# Patient Record
Sex: Female | Born: 1995 | Race: White | Hispanic: No | Marital: Single | Smoking: Current every day smoker
Health system: Southern US, Community
[De-identification: ages and names within clinical notes are randomized; demographics above are authoritative.]

## PROBLEM LIST (undated history)

## (undated) ENCOUNTER — Inpatient Hospital Stay (HOSPITAL_COMMUNITY): Payer: Self-pay

## (undated) DIAGNOSIS — F419 Anxiety disorder, unspecified: Secondary | ICD-10-CM

## (undated) HISTORY — PX: DILATION AND CURETTAGE OF UTERUS: SHX78

## (undated) HISTORY — DX: Anxiety disorder, unspecified: F41.9

---
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
## 2015-01-02 ENCOUNTER — Emergency Department (HOSPITAL_COMMUNITY)
Admission: EM | Admit: 2015-01-02 | Discharge: 2015-01-02 | Discharge status: Against Medical Advice | Payer: Medicaid Other | Attending: Emergency Medicine | Admitting: Emergency Medicine

## 2015-01-02 ENCOUNTER — Encounter (HOSPITAL_COMMUNITY): Payer: Self-pay | Admitting: Emergency Medicine

## 2015-01-02 ENCOUNTER — Emergency Department (HOSPITAL_COMMUNITY): Payer: Medicaid Other

## 2015-01-02 DIAGNOSIS — Y9289 Other specified places as the place of occurrence of the external cause: Secondary | ICD-10-CM | POA: Diagnosis not present

## 2015-01-02 DIAGNOSIS — Y998 Other external cause status: Secondary | ICD-10-CM | POA: Diagnosis not present

## 2015-01-02 DIAGNOSIS — W1809XA Striking against other object with subsequent fall, initial encounter: Secondary | ICD-10-CM | POA: Diagnosis not present

## 2015-01-02 DIAGNOSIS — S99929A Unspecified injury of unspecified foot, initial encounter: Secondary | ICD-10-CM | POA: Insufficient documentation

## 2015-01-02 DIAGNOSIS — S8990XA Unspecified injury of unspecified lower leg, initial encounter: Secondary | ICD-10-CM | POA: Insufficient documentation

## 2015-01-02 DIAGNOSIS — Z720 Tobacco use: Secondary | ICD-10-CM | POA: Insufficient documentation

## 2015-01-02 DIAGNOSIS — Y9341 Activity, dancing: Secondary | ICD-10-CM | POA: Insufficient documentation

## 2015-01-02 NOTE — ED Notes (Signed)
Pt was at working dancing when she hit her foot and then fell and hit her knee.  Pt states that she felt a "pop" in her knee.

## 2017-04-22 ENCOUNTER — Encounter: Payer: Self-pay | Admitting: Obstetrics and Gynecology

## 2017-04-22 ENCOUNTER — Other Ambulatory Visit (HOSPITAL_COMMUNITY)
Admission: RE | Admit: 2017-04-22 | Discharge: 2017-04-22 | Disposition: A | Discharge status: Home / Self Care | Payer: Medicaid Other | Source: Ambulatory Visit | Attending: Obstetrics and Gynecology | Admitting: Obstetrics and Gynecology

## 2017-04-22 ENCOUNTER — Encounter (INDEPENDENT_AMBULATORY_CARE_PROVIDER_SITE_OTHER): Payer: Medicaid Other | Admitting: *Deleted

## 2017-04-22 ENCOUNTER — Ambulatory Visit (INDEPENDENT_AMBULATORY_CARE_PROVIDER_SITE_OTHER): Payer: Medicaid Other | Admitting: Obstetrics and Gynecology

## 2017-04-22 VITALS — BP 113/69 | HR 90 | Wt 99.0 lb

## 2017-04-22 DIAGNOSIS — G43909 Migraine, unspecified, not intractable, without status migrainosus: Secondary | ICD-10-CM | POA: Insufficient documentation

## 2017-04-22 DIAGNOSIS — O021 Missed abortion: Secondary | ICD-10-CM | POA: Insufficient documentation

## 2017-04-22 DIAGNOSIS — Z3481 Encounter for supervision of other normal pregnancy, first trimester: Secondary | ICD-10-CM

## 2017-04-22 DIAGNOSIS — O09299 Supervision of pregnancy with other poor reproductive or obstetric history, unspecified trimester: Secondary | ICD-10-CM | POA: Insufficient documentation

## 2017-04-22 DIAGNOSIS — O09291 Supervision of pregnancy with other poor reproductive or obstetric history, first trimester: Secondary | ICD-10-CM

## 2017-04-22 DIAGNOSIS — O26891 Other specified pregnancy related conditions, first trimester: Secondary | ICD-10-CM | POA: Diagnosis not present

## 2017-04-22 DIAGNOSIS — Z3480 Encounter for supervision of other normal pregnancy, unspecified trimester: Secondary | ICD-10-CM | POA: Diagnosis present

## 2017-04-22 DIAGNOSIS — Z3A13 13 weeks gestation of pregnancy: Secondary | ICD-10-CM | POA: Diagnosis not present

## 2017-04-22 DIAGNOSIS — G43019 Migraine without aura, intractable, without status migrainosus: Secondary | ICD-10-CM

## 2017-04-22 DIAGNOSIS — Z3490 Encounter for supervision of normal pregnancy, unspecified, unspecified trimester: Secondary | ICD-10-CM | POA: Insufficient documentation

## 2017-04-22 MED ORDER — PRENATAL GUMMIES/DHA & FA 0.4-32.5 MG PO CHEW: 1.0000 | CHEWABLE_TABLET | Freq: Every day | ORAL | 12 refills | Status: DC

## 2017-04-22 MED ORDER — PROMETHAZINE HCL 25 MG PO TABS: 25.0000 mg | ORAL_TABLET | Freq: Four times a day (QID) | ORAL | 3 refills | Status: DC | PRN

## 2017-04-22 MED ORDER — CYCLOBENZAPRINE HCL 5 MG PO TABS: 5.0000 mg | ORAL_TABLET | Freq: Three times a day (TID) | ORAL | 3 refills | Status: DC | PRN

## 2017-04-22 MED ORDER — METOCLOPRAMIDE HCL 10 MG PO TABS: 10.0000 mg | ORAL_TABLET | Freq: Four times a day (QID) | ORAL | 3 refills | Status: DC

## 2017-04-22 NOTE — Patient Instructions (Signed)
Morning Sickness Morning sickness is when you feel sick to your stomach (nauseous) during pregnancy. This nauseous feeling may or may not come with vomiting. It often occurs in the morning but can be a problem any time of day. Morning sickness is most common during the first trimester, but it may continue throughout pregnancy. While morning sickness is unpleasant, it is usually harmless unless you develop severe and continual vomiting (hyperemesis gravidarum). This condition requires more intense treatment. What are the causes? The cause of morning sickness is not completely known but seems to be related to normal hormonal changes that occur in pregnancy. What increases the risk? You are at greater risk if you:  Experienced nausea or vomiting before your pregnancy.  Had morning sickness during a previous pregnancy.  Are pregnant with more than one baby, such as twins.  How is this treated? Do not use any medicines (prescription, over-the-counter, or herbal) for morning sickness without first talking to your health care provider. Your health care provider may prescribe or recommend:  Vitamin B6 supplements.  Anti-nausea medicines.  The herbal medicine ginger.  Follow these instructions at home:  Only take over-the-counter or prescription medicines as directed by your health care provider.  Taking multivitamins before getting pregnant can prevent or decrease the severity of morning sickness in most women.  Eat a piece of dry toast or unsalted crackers before getting out of bed in the morning.  Eat five or six small meals a day.  Eat dry and bland foods (rice, baked potato). Foods high in carbohydrates are often helpful.  Do not drink liquids with your meals. Drink liquids between meals.  Avoid greasy, fatty, and spicy foods.  Get someone to cook for you if the smell of any food causes nausea and vomiting.  If you feel nauseous after taking prenatal vitamins, take the vitamins at  night or with a snack.  Snack on protein foods (nuts, yogurt, cheese) between meals if you are hungry.  Eat unsweetened gelatins for desserts.  Wearing an acupressure wristband (worn for sea sickness) may be helpful.  Acupuncture may be helpful.  Do not smoke.  Get a humidifier to keep the air in your house free of odors.  Get plenty of fresh air. Contact a health care provider if:  Your home remedies are not working, and you need medicine.  You feel dizzy or lightheaded.  You are losing weight. Get help right away if:  You have persistent and uncontrolled nausea and vomiting.  You pass out (faint). This information is not intended to replace advice given to you by your health care provider. Make sure you discuss any questions you have with your health care provider. Document Released: 09/06/2006 Document Revised: 12/22/2015 Document Reviewed: 12/31/2012 Elsevier Interactive Patient Education  2017 [employer] Eating Plan for Pregnant Women While you are pregnant, your body will require additional nutrition to help support your growing baby. It is recommended that you consume:  150 additional calories each day during your first trimester.  300 additional calories each day during your second trimester.  300 additional calories each day during your third trimester.  Eating a healthy, well-balanced diet is very important for your health and for your baby's health. You also have a higher need for some vitamins and minerals, such as folic acid, calcium, iron, and vitamin D. What do I need to know about eating during pregnancy?  Do not try to lose weight or go on a diet during pregnancy.  Choose healthy, nutritious  foods. Choose  of a sandwich with a glass of milk instead of a candy bar or a high-calorie sugar-sweetened beverage.  Limit your overall intake of foods that have "empty calories." These are foods that have little nutritional value, such as sweets, desserts,  candies, sugar-sweetened beverages, and fried foods.  Eat a variety of foods, especially fruits and vegetables.  Take a prenatal vitamin to help meet the additional needs during pregnancy, specifically for folic acid, iron, calcium, and vitamin D.  Remember to stay active. Ask your health care provider for exercise recommendations that are specific to you.  Practice good food safety and cleanliness, such as washing your hands before you eat and after you prepare raw meat. This helps to prevent foodborne illnesses, such as listeriosis, that can be very dangerous for your baby. Ask your health care provider for more information about listeriosis. What does 150 extra calories look like? Healthy options for an additional 150 calories each day could be any of the following:  Plain low-fat yogurt (6-8 oz) with  cup of berries.  1 apple with 2 teaspoons of peanut butter.  Cut-up vegetables with  cup of hummus.  Low-fat chocolate milk (8 oz or 1 cup).  1 string cheese with 1 medium orange.   of a peanut butter and jelly sandwich on whole-wheat bread (1 tsp of peanut butter).  For 300 calories, you could eat two of those healthy options each day. What is a healthy amount of weight to gain? The recommended amount of weight for you to gain is based on your pre-pregnancy BMI. If your pre-pregnancy BMI was:  Less than 18 (underweight), you should gain 28-40 lb.  18-24.9 (normal), you should gain 25-35 lb.  25-29.9 (overweight), you should gain 15-25 lb.  Greater than 30 (obese), you should gain 11-20 lb.  What if I am having twins or multiples? Generally, pregnant women who will be having twins or multiples may need to increase their daily calories by 300-600 calories each day. The recommended range for total weight gain is 25-54 lb, depending on your pre-pregnancy BMI. Talk with your health care provider for specific guidance about additional nutritional needs, weight gain, and exercise  during your pregnancy. What foods can I eat? Grains Any grains. Try to choose whole grains, such as whole-wheat bread, oatmeal, or brown rice. Vegetables Any vegetables. Try to eat a variety of colors and types of vegetables to get a full range of vitamins and minerals. Remember to wash your vegetables well before eating. Fruits Any fruits. Try to eat a variety of colors and types of fruit to get a full range of vitamins and minerals. Remember to wash your fruits well before eating. Meats and Other Protein Sources Lean meats, including chicken, [place], fish, and lean cuts of beef, veal, or pork. Make sure that all meats are cooked to "well done." Tofu. Tempeh. Beans. Eggs. Peanut butter and other nut butters. Seafood, such as shrimp, crab, and lobster. If you choose fish, select types that are higher in omega-3 fatty acids, including salmon, herring, mussels, trout, sardines, and pollock. Make sure that all meats are cooked to food-safe temperatures. Dairy Pasteurized milk and milk alternatives. Pasteurized yogurt and pasteurized cheese. Cottage cheese. Sour cream. Beverages Water. Juices that contain 100% fruit juice or vegetable juice. Caffeine-free teas and decaffeinated coffee. Drinks that contain caffeine are okay to drink, but it is better to avoid caffeine. Keep your total caffeine intake to less than 200 mg each day (12 oz of  coffee, tea, or soda) or as directed by your health care provider. Condiments Any pasteurized condiments. Sweets and Desserts Any sweets and desserts. Fats and Oils Any fats and oils. The items listed above may not be a complete list of recommended foods or beverages. Contact your dietitian for more options. What foods are not recommended? Vegetables Unpasteurized (raw) vegetable juices. Fruits Unpasteurized (raw) fruit juices. Meats and Other Protein Sources Cured meats that have nitrates, such as bacon, salami, and hotdogs. Luncheon meats, bologna, or other  deli meats (unless they are reheated until they are steaming hot). Refrigerated pate, meat spreads from a meat counter, smoked seafood that is found in the refrigerated section of a store. Raw fish, such as sushi or sashimi. High mercury content fish, such as tilefish, shark, swordfish, and king mackerel. Raw meats, such as tuna or beef tartare. Undercooked meats and poultry. Make sure that all meats are cooked to food-safe temperatures. Dairy Unpasteurized (raw) milk and any foods that have raw milk in them. Soft cheeses, such as feta, queso blanco, queso fresco, Brie, Camembert cheeses, blue-veined cheeses, and Panela cheese (unless it is made with pasteurized milk, which must be stated on the label). Beverages Alcohol. Sugar-sweetened beverages, such as sodas, teas, or energy drinks. Condiments Homemade fermented foods and drinks, such as pickles, sauerkraut, or kombucha drinks. (Store-bought pasteurized versions of these are okay.) Other Salads that are made in the store, such as ham salad, chicken salad, egg salad, tuna salad, and seafood salad. The items listed above may not be a complete list of foods and beverages to avoid. Contact your dietitian for more information. This information is not intended to replace advice given to you by your health care provider. Make sure you discuss any questions you have with your health care provider. Document Released: 04/30/2014 Document Revised: 12/22/2015 Document Reviewed: 12/29/2013 Elsevier Interactive Patient Education  2018 [employer]. First Trimester of Pregnancy The first trimester of pregnancy is from week 1 until the end of week 13 (months 1 through 3). A week after a sperm fertilizes an egg, the egg will implant on the wall of the uterus. This embryo will begin to develop into a baby. Genes from you and your partner will form the baby. The female genes will determine whether the baby will be a boy or a girl. At 6-8 weeks, the eyes and face will  be formed, and the heartbeat can be seen on ultrasound. At the end of 12 weeks, all the baby's organs will be formed. Now that you are pregnant, you will want to do everything you can to have a healthy baby. Two of the most important things are to get good prenatal care and to follow your health care provider's instructions. Prenatal care is all the medical care you receive before the baby's birth. This care will help prevent, find, and treat any problems during the pregnancy and childbirth. Body changes during your first trimester Your body goes through many changes during pregnancy. The changes vary from woman to woman.  You may gain or lose a couple of pounds at first.  You may feel sick to your stomach (nauseous) and you may throw up (vomit). If the vomiting is uncontrollable, call your health care provider.  You may tire easily.  You may develop headaches that can be relieved by medicines. All medicines should be approved by your health care provider.  You may urinate more often. Painful urination may mean you have a bladder infection.  You may develop  heartburn as a result of your pregnancy.  You may develop constipation because certain hormones are causing the muscles that push stool through your intestines to slow down.  You may develop hemorrhoids or swollen veins (varicose veins).  Your breasts may begin to grow larger and become tender. Your nipples may stick out more, and the tissue that surrounds them (areola) may become darker.  Your gums may bleed and may be sensitive to brushing and flossing.  Dark spots or blotches (chloasma, mask of pregnancy) may develop on your face. This will likely fade after the baby is born.  Your menstrual periods will stop.  You may have a loss of appetite.  You may develop cravings for certain kinds of food.  You may have changes in your emotions from day to day, such as being excited to be pregnant or being concerned that something may go  wrong with the pregnancy and baby.  You may have more vivid and strange dreams.  You may have changes in your hair. These can include thickening of your hair, rapid growth, and changes in texture. Some women also have hair loss during or after pregnancy, or hair that feels dry or thin. Your hair will most likely return to normal after your baby is born.  What to expect at prenatal visits During a routine prenatal visit:  You will be weighed to make sure you and the baby are growing normally.  Your blood pressure will be taken.  Your abdomen will be measured to track your baby's growth.  The fetal heartbeat will be listened to between weeks 10 and 14 of your pregnancy.  Test results from any previous visits will be discussed.  Your health care provider may ask you:  How you are feeling.  If you are feeling the baby move.  If you have had any abnormal symptoms, such as leaking fluid, bleeding, severe headaches, or abdominal cramping.  If you are using any tobacco products, including cigarettes, chewing tobacco, and electronic cigarettes.  If you have any questions.  Other tests that may be performed during your first trimester include:  Blood tests to find your blood type and to check for the presence of any previous infections. The tests will also be used to check for low iron levels (anemia) and protein on red blood cells (Rh antibodies). Depending on your risk factors, or if you previously had diabetes during pregnancy, you may have tests to check for high blood sugar that affects pregnant women (gestational diabetes).  Urine tests to check for infections, diabetes, or protein in the urine.  An ultrasound to confirm the proper growth and development of the baby.  Fetal screens for spinal cord problems (spina bifida) and Down syndrome.  HIV (human immunodeficiency virus) testing. Routine prenatal testing includes screening for HIV, unless you choose not to have this  test.  You may need other tests to make sure you and the baby are doing well.  Follow these instructions at home: Medicines  Follow your health care provider's instructions regarding medicine use. Specific medicines may be either safe or unsafe to take during pregnancy.  Take a prenatal vitamin that contains at least 600 micrograms (mcg) of folic acid.  If you develop constipation, try taking a stool softener if your health care provider approves. Eating and drinking  Eat a balanced diet that includes fresh fruits and vegetables, whole grains, good sources of protein such as meat, eggs, or tofu, and low-fat dairy. Your health care provider will help  you determine the amount of weight gain that is right for you.  Avoid raw meat and uncooked cheese. These carry germs that can cause birth defects in the baby.  Eating four or five small meals rather than three large meals a day may help relieve nausea and vomiting. If you start to feel nauseous, eating a few soda crackers can be helpful. Drinking liquids between meals, instead of during meals, also seems to help ease nausea and vomiting.  Limit foods that are high in fat and processed sugars, such as fried and sweet foods.  To prevent constipation: ? Eat foods that are high in fiber, such as fresh fruits and vegetables, whole grains, and beans. ? Drink enough fluid to keep your urine clear or pale yellow. Activity  Exercise only as directed by your health care provider. Most women can continue their usual exercise routine during pregnancy. Try to exercise for 30 minutes at least 5 days a week. Exercising will help you: ? Control your weight. ? Stay in shape. ? Be prepared for labor and delivery.  Experiencing pain or cramping in the lower abdomen or lower back is a good sign that you should stop exercising. Check with your health care provider before continuing with normal exercises.  Try to avoid standing for long periods of time. Move  your legs often if you must stand in one place for a long time.  Avoid heavy lifting.  Wear low-heeled shoes and practice good posture.  You may continue to have sex unless your health care provider tells you not to. Relieving pain and discomfort  Wear a good support bra to relieve breast tenderness.  Take warm sitz baths to soothe any pain or discomfort caused by hemorrhoids. Use hemorrhoid cream if your health care provider approves.  Rest with your legs elevated if you have leg cramps or low back pain.  If you develop varicose veins in your legs, wear support hose. Elevate your feet for 15 minutes, 3-4 times a day. Limit salt in your diet. Prenatal care  Schedule your prenatal visits by the twelfth week of pregnancy. They are usually scheduled monthly at first, then more often in the last 2 months before delivery.  Write down your questions. Take them to your prenatal visits.  Keep all your prenatal visits as told by your health care provider. This is important. Safety  Wear your seat belt at all times when driving.  Make a list of emergency phone numbers, including numbers for family, friends, the hospital, and police and fire departments. General instructions  Ask your health care provider for a referral to a local prenatal education class. Begin classes no later than the beginning of month 6 of your pregnancy.  Ask for help if you have counseling or nutritional needs during pregnancy. Your health care provider can offer advice or refer you to specialists for help with various needs.  Do not use hot tubs, steam rooms, or saunas.  Do not douche or use tampons or scented sanitary pads.  Do not cross your legs for long periods of time.  Avoid cat litter boxes and soil used by cats. These carry germs that can cause birth defects in the baby and possibly loss of the fetus by miscarriage or stillbirth.  Avoid all smoking, herbs, alcohol, and medicines not prescribed by your  health care provider. Chemicals in these products affect the formation and growth of the baby.  Do not use any products that contain nicotine or tobacco, such as  cigarettes and e-cigarettes. If you need help quitting, ask your health care provider. You may receive counseling support and other resources to help you quit.  Schedule a dentist appointment. At home, brush your teeth with a soft toothbrush and be gentle when you floss. Contact a health care provider if:  You have dizziness.  You have mild pelvic cramps, pelvic pressure, or nagging pain in the abdominal area.  You have persistent nausea, vomiting, or diarrhea.  You have a bad smelling vaginal discharge.  You have pain when you urinate.  You notice increased swelling in your face, hands, legs, or ankles.  You are exposed to fifth disease or chickenpox.  You are exposed to [place] measles (rubella) and have never had it. Get help right away if:  You have a fever.  You are leaking fluid from your vagina.  You have spotting or bleeding from your vagina.  You have severe abdominal cramping or pain.  You have rapid weight gain or loss.  You vomit blood or material that looks like coffee grounds.  You develop a severe headache.  You have shortness of breath.  You have any kind of trauma, such as from a fall or a car accident. Summary  The first trimester of pregnancy is from week 1 until the end of week 13 (months 1 through 3).  Your body goes through many changes during pregnancy. The changes vary from woman to woman.  You will have routine prenatal visits. During those visits, your health care provider will examine you, discuss any test results you may have, and talk with you about how you are feeling. This information is not intended to replace advice given to you by your health care provider. Make sure you discuss any questions you have with your health care provider. Document Released: 07/10/2001 Document  Revised: 06/27/2016 Document Reviewed: 06/27/2016 Elsevier Interactive Patient Education  2017 [employer].

## 2017-04-22 NOTE — Progress Notes (Signed)
Bedside U/S shows IUP with CRL of 66.38mm  GA [id number]  FHT is 162 BPM

## 2017-04-22 NOTE — Progress Notes (Addendum)
  Subjective:    Christine Whitehead is being seen today for her first obstetrical visit.  This is not a planned pregnancy. She is at [redacted]w[redacted]d gestation. Her obstetrical history is significant for h/o MAB (12/2013). Relationship with FOB: significant other, Jeri Modena. Patient unsure intend to breast feed. Pregnancy history fully reviewed.  Patient reports nausea and vomiting; "unable to keep most food and fluids down". She works as a Horticulturist, commercial at [employer] and finding it difficult to work with all the N/V.  Review of Systems:   Review of Systems  Constitutional: Positive for appetite change.  HENT: Negative.   Eyes: Negative.   Respiratory: Negative.   Cardiovascular: Negative.   Gastrointestinal: Positive for constipation ("lasts for weeks"), nausea and vomiting ("unable to keep any food down").  Endocrine: Negative.   Genitourinary: Negative.   Musculoskeletal: Negative.   Skin: Negative.   Allergic/Immunologic: Negative.   Neurological: Positive for headaches (migraines).  Hematological: Negative.   Psychiatric/Behavioral: Negative.     Objective:     BP 113/69   Pulse 90   Wt 99 lb (44.9 kg)   LMP 01/15/2017   BMI 18.71 kg/m  Physical Exam  Nursing note and vitals reviewed. Constitutional: She is oriented to person, place, and time. She appears well-developed and well-nourished.  HENT:  Head: Normocephalic.  Mouth/Throat:    2 nose piercings (septal and RT nare)  Eyes: Pupils are equal, round, and reactive to light.  Neck: Normal range of motion.  Cardiovascular: Normal rate, regular rhythm and normal heart sounds.   Respiratory: Effort normal and breath sounds normal.    GI: Soft. Bowel sounds are normal.  Genitourinary: Vagina normal and uterus normal.  Genitourinary Comments: Uterus: enlarged, S=D, cx: smooth, pink, no lesions, scant amt of thick, white vaginal d/c, closed/long/firm, no CMT, (+) friability with pap sampling, no adnexal tenderness // Pap done; GC/CT on urine    Musculoskeletal: Normal range of motion.  Neurological: She is alert and oriented to person, place, and time. She has normal reflexes.  Skin: Skin is warm and dry.  Psychiatric: She has a normal mood and affect. Her behavior is normal. Judgment and thought content normal.    Maternal Exam:  Abdomen: Patient reports no abdominal tenderness. Fundal height is S=D.   Fetal presentation: no presenting part  Introitus: Normal vulva. Normal vagina.  Ferning test: not done.  Nitrazine test: not done. Amniotic fluid character: not assessed.  Pelvis: adequate for delivery.   Cervix: Cervix evaluated by sterile speculum exam.    (+) FHTs 162 bpm; S=D by informal BS U/S as stated in RN note    Assessment:    Pregnancy: [id number] Patient Active Problem List   Diagnosis Date Noted  . History of miscarriage, currently pregnant 04/22/2017  . Encounter for supervision of normal pregnancy 04/22/2017  . Missed abortion 12/29/2013       Plan:     Initial labs drawn. Prenatal vitamins, Phenergan 25 mg tablets (advised to take if Reglan not helping), Reglan 10 mg, Flexeril 5 mg tabs for migraines. Problem list reviewed and updated. AFP3 discussed: requested. Role of ultrasound in pregnancy discussed; fetal survey: Planning for 18-20 wks. Amniocentesis discussed: not indicated. Patient verbalized an understanding of the plan of care and agrees. Follow up in 4 weeks. 75% of 30 min visit spent on counseling and coordination of care.     Raelyn Mora, MSN, CNM 04/22/2017

## 2017-04-23 LAB — OBSTETRIC PANEL
Antibody Screen: NOT DETECTED
Basophils Absolute: 90 cells/uL (ref 0–200)
Basophils Relative: 0.9 %
EOS PCT: 2.7 %
Eosinophils Absolute: 270 cells/uL (ref 15–500)
HEMATOCRIT: 34.1 % — AB (ref 35.0–45.0)
Hemoglobin: 11.9 g/dL (ref 11.7–15.5)
Hepatitis B Surface Ag: NONREACTIVE
Lymphs Abs: 2650 cells/uL (ref 850–3900)
MCH: 33.1 pg — ABNORMAL HIGH (ref 27.0–33.0)
MCHC: 34.9 g/dL (ref 32.0–36.0)
MCV: 95 fL (ref 80.0–100.0)
MONOS PCT: 6.2 %
MPV: 10.7 fL (ref 7.5–12.5)
NEUTROS PCT: 63.7 %
Neutro Abs: 6370 cells/uL (ref 1500–7800)
Platelets: 267 10*3/uL (ref 140–400)
RBC: 3.59 10*6/uL — ABNORMAL LOW (ref 3.80–5.10)
RDW: 12 % (ref 11.0–15.0)
RPR: NONREACTIVE
Rubella: 1.1 index
Total Lymphocyte: 26.5 %
WBC mixed population: 620 cells/uL (ref 200–950)
WBC: 10 10*3/uL (ref 3.8–10.8)

## 2017-04-23 LAB — URINE CYTOLOGY ANCILLARY ONLY
CHLAMYDIA, DNA PROBE: NEGATIVE
Neisseria Gonorrhea: NEGATIVE

## 2017-04-24 LAB — CULTURE, URINE COMPREHENSIVE
MICRO NUMBER:: 81054032
SPECIMEN QUALITY: ADEQUATE

## 2017-04-24 LAB — CYTOLOGY - PAP: Diagnosis: NEGATIVE

## 2017-05-20 ENCOUNTER — Ambulatory Visit (INDEPENDENT_AMBULATORY_CARE_PROVIDER_SITE_OTHER): Payer: Medicaid Other | Admitting: Obstetrics and Gynecology

## 2017-05-20 DIAGNOSIS — Z3402 Encounter for supervision of normal first pregnancy, second trimester: Secondary | ICD-10-CM | POA: Diagnosis not present

## 2017-05-20 MED ORDER — FAMOTIDINE 20 MG PO TABS: 20.0000 mg | ORAL_TABLET | Freq: Two times a day (BID) | ORAL | 6 refills | Status: DC

## 2017-05-20 NOTE — Progress Notes (Signed)
   PRENATAL VISIT NOTE  Subjective:  Christine Whitehead is a 21 y.o. G2P0010 at 5382w6d being seen today for ongoing prenatal care.  She is currently monitored for the following issues for this low-risk pregnancy and has History of miscarriage, currently pregnant; Encounter for supervision of normal pregnancy; and Missed abortion on her problem list.  Patient reports heartburn.  Contractions: Not present. Vag. Bleeding: None.  Movement: Present. Denies leaking of fluid.   The following portions of the patient's history were reviewed and updated as appropriate: allergies, current medications, past family history, past medical history, past social history, past surgical history and problem list. Problem list updated.  Objective:   Vitals:   05/20/17 1323  BP: 122/82  Pulse: 99  Weight: 104 lb (47.2 kg)    Fetal Status: Fetal Heart Rate (bpm): 158   Movement: Present     General:  Alert, oriented and cooperative. Patient is in no acute distress.  Skin: Skin is warm and dry. No rash noted.   Cardiovascular: Normal heart rate noted  Respiratory: Normal respiratory effort, no problems with respiration noted  Abdomen: Soft, gravid, appropriate for gestational age.  Pain/Pressure: Absent     Pelvic: Cervical exam deferred        Extremities: Normal range of motion.  Edema: None  Mental Status:  Normal mood and affect. Normal behavior. Normal judgment and thought content.   Assessment and Plan:  Pregnancy: G2P0010 at 3882w6d  1. Encounter for supervision of normal first pregnancy in second trimester Patient is doing well without complaints Anatomy ultrasound ordered Quad screen and HIV today Rx Pepcid provided - AFP, Quad Screen  General obstetric precautions including but not limited to vaginal bleeding, contractions, leaking of fluid and fetal movement were reviewed in detail with the patient. Please refer to After Visit Summary for other counseling recommendations.  Return in about 4 weeks  (around 06/17/2017) for ROB.   Catalina AntiguaPeggy Jeorge Reister, MD

## 2017-05-21 LAB — AFP, QUAD SCREEN
AFP: 53.5 ng/mL
Curr Gest Age: 17.9 weeks
Down Syndrome Scr Risk Est: 1
HCG, Total: 41.15 IU/mL
INH: 550 pg/mL
Maternal Wt: 103 [lb_av]
MoM for AFP: 0.98
MoM for INH: 2.69
MoM for hCG: 1.31
Osb Risk: <1
Twins-AFP: 1
uE3 Mom: 0.73
uE3 Value: 1.03 ng/mL

## 2017-05-21 LAB — HIV ANTIBODY (ROUTINE TESTING W REFLEX): HIV 1&2 Ab, 4th Generation: NONREACTIVE

## 2017-05-28 ENCOUNTER — Ambulatory Visit (HOSPITAL_COMMUNITY)
Admission: RE | Admit: 2017-05-28 | Discharge: 2017-05-28 | Disposition: A | Discharge status: Home / Self Care | Payer: Medicaid Other | Source: Ambulatory Visit | Attending: Obstetrics and Gynecology | Admitting: Obstetrics and Gynecology

## 2017-05-28 ENCOUNTER — Other Ambulatory Visit: Payer: Self-pay | Admitting: Obstetrics and Gynecology

## 2017-05-28 DIAGNOSIS — O321XX0 Maternal care for breech presentation, not applicable or unspecified: Secondary | ICD-10-CM | POA: Insufficient documentation

## 2017-05-28 DIAGNOSIS — Z3A19 19 weeks gestation of pregnancy: Secondary | ICD-10-CM

## 2017-05-28 DIAGNOSIS — Z3689 Encounter for other specified antenatal screening: Secondary | ICD-10-CM | POA: Diagnosis not present

## 2017-05-28 DIAGNOSIS — Z3402 Encounter for supervision of normal first pregnancy, second trimester: Secondary | ICD-10-CM

## 2017-06-17 ENCOUNTER — Ambulatory Visit (INDEPENDENT_AMBULATORY_CARE_PROVIDER_SITE_OTHER): Payer: Medicaid Other | Admitting: Obstetrics & Gynecology

## 2017-06-17 ENCOUNTER — Other Ambulatory Visit (HOSPITAL_COMMUNITY)
Admission: RE | Admit: 2017-06-17 | Discharge: 2017-06-17 | Disposition: A | Discharge status: Home / Self Care | Payer: Medicaid Other | Source: Ambulatory Visit | Attending: Obstetrics & Gynecology | Admitting: Obstetrics & Gynecology

## 2017-06-17 VITALS — BP 130/74 | HR 100 | Wt 112.0 lb

## 2017-06-17 DIAGNOSIS — N898 Other specified noninflammatory disorders of vagina: Secondary | ICD-10-CM | POA: Diagnosis not present

## 2017-06-17 DIAGNOSIS — Z3400 Encounter for supervision of normal first pregnancy, unspecified trimester: Secondary | ICD-10-CM

## 2017-06-17 DIAGNOSIS — Z3402 Encounter for supervision of normal first pregnancy, second trimester: Secondary | ICD-10-CM

## 2017-06-17 DIAGNOSIS — Z3689 Encounter for other specified antenatal screening: Secondary | ICD-10-CM

## 2017-06-17 NOTE — Patient Instructions (Signed)

## 2017-06-17 NOTE — Progress Notes (Signed)
Patient complaining of large amount of mucous discharge over the weekend. Christine StammerJennifer Howard RN    PRENATAL VISIT NOTE  Subjective:  Christine BachJasmine Whitehead is a 21 y.o. G2P0010 at 6881w6d being seen today for ongoing prenatal care.  She is currently monitored for the following issues for this low-risk pregnancy and has History of miscarriage, currently pregnant; Encounter for supervision of normal pregnancy; and Missed abortion on their problem list.  Patient reports vaginal discharge this weekend..  Contractions: Not present. Vag. Bleeding: None.  Movement: Present. Denies leaking of fluid.   The following portions of the patient's history were reviewed and updated as appropriate: allergies, current medications, past family history, past medical history, past social history, past surgical history and problem list. Problem list updated.  Objective:   Vitals:   06/17/17 1400  BP: 130/74  Pulse: 100  Weight: 112 lb (50.8 kg)    Fetal Status: Fetal Heart Rate (bpm): 160   Movement: Present     General:  Alert, oriented and cooperative. Patient is in no acute distress.  Skin: Skin is warm and dry. No rash noted.   Cardiovascular: Normal heart rate noted  Respiratory: Normal respiratory effort, no problems with respiration noted  Abdomen: Soft, gravid, appropriate for gestational age.  Pain/Pressure: Absent     Pelvic: Cervical exam performedclosed/long/high; scant discharge        Extremities: Normal range of motion.  Edema: None  Mental Status:  Normal mood and affect. Normal behavior. Normal judgment and thought content.   Assessment and Plan:  Pregnancy: G2P0010 at 3281w6d  1. Encounter for fetal anatomic survey - Incomplete anatomy - US MFM OB FOLLOW UP; Future  2. Vaginal discharge - Cervicovaginal ancillary only - Cervix closed and long, scant white discharge  Preterm labor symptoms and general obstetric precautions including but not limited to vaginal bleeding, contractions, leaking of  fluid and fetal movement were reviewed in detail with the patient. Please refer to After Visit Summary for other counseling recommendations.  Return in about 4 weeks (around 07/15/2017).   Christine LincolnKelly Rhylie Stehr, MD

## 2017-06-19 LAB — CERVICOVAGINAL ANCILLARY ONLY
Bacterial vaginitis: NEGATIVE
Candida vaginitis: POSITIVE — AB
Chlamydia: NEGATIVE
Neisseria Gonorrhea: NEGATIVE

## 2017-06-24 ENCOUNTER — Other Ambulatory Visit: Payer: Self-pay | Admitting: Obstetrics & Gynecology

## 2017-06-24 MED ORDER — TERCONAZOLE 0.4 % VA CREA: 1.0000 | TOPICAL_CREAM | Freq: Every day | VAGINAL | 0 refills | Status: DC

## 2017-06-25 ENCOUNTER — Telehealth: Payer: Self-pay | Admitting: *Deleted

## 2017-06-25 NOTE — Telephone Encounter (Signed)
Pt notified of positive candida and Dr leggett has sent in a RX for [employer] [facility] to [facility] [place].

## 2017-07-09 ENCOUNTER — Other Ambulatory Visit: Payer: Self-pay | Admitting: Obstetrics & Gynecology

## 2017-07-09 ENCOUNTER — Ambulatory Visit (HOSPITAL_COMMUNITY)
Admission: RE | Admit: 2017-07-09 | Discharge: 2017-07-09 | Disposition: A | Discharge status: Home / Self Care | Payer: Medicaid Other | Source: Ambulatory Visit | Attending: Obstetrics & Gynecology | Admitting: Obstetrics & Gynecology

## 2017-07-09 DIAGNOSIS — IMO0002 Reserved for concepts with insufficient information to code with codable children: Secondary | ICD-10-CM

## 2017-07-09 DIAGNOSIS — Z362 Encounter for other antenatal screening follow-up: Secondary | ICD-10-CM | POA: Diagnosis not present

## 2017-07-09 DIAGNOSIS — Z0489 Encounter for examination and observation for other specified reasons: Secondary | ICD-10-CM

## 2017-07-09 DIAGNOSIS — Z3689 Encounter for other specified antenatal screening: Secondary | ICD-10-CM

## 2017-07-09 DIAGNOSIS — Z3A25 25 weeks gestation of pregnancy: Secondary | ICD-10-CM

## 2017-07-11 ENCOUNTER — Other Ambulatory Visit: Payer: Self-pay | Admitting: Obstetrics & Gynecology

## 2017-07-11 DIAGNOSIS — Z3A25 25 weeks gestation of pregnancy: Secondary | ICD-10-CM

## 2017-07-11 DIAGNOSIS — Z362 Encounter for other antenatal screening follow-up: Secondary | ICD-10-CM

## 2017-07-15 ENCOUNTER — Encounter: Payer: Medicaid Other | Admitting: Obstetrics & Gynecology

## 2017-07-25 ENCOUNTER — Encounter: Payer: Medicaid Other | Admitting: Obstetrics & Gynecology

## 2017-08-09 ENCOUNTER — Ambulatory Visit (INDEPENDENT_AMBULATORY_CARE_PROVIDER_SITE_OTHER): Payer: Medicaid Other | Admitting: Certified Nurse Midwife

## 2017-08-09 VITALS — BP 131/75 | HR 95 | Wt 124.0 lb

## 2017-08-09 DIAGNOSIS — Z3490 Encounter for supervision of normal pregnancy, unspecified, unspecified trimester: Secondary | ICD-10-CM

## 2017-08-09 DIAGNOSIS — O9989 Other specified diseases and conditions complicating pregnancy, childbirth and the puerperium: Secondary | ICD-10-CM

## 2017-08-09 DIAGNOSIS — M549 Dorsalgia, unspecified: Secondary | ICD-10-CM

## 2017-08-09 DIAGNOSIS — O99891 Other specified diseases and conditions complicating pregnancy: Secondary | ICD-10-CM

## 2017-08-09 MED ORDER — CYCLOBENZAPRINE HCL 5 MG PO TABS: 5.0000 mg | ORAL_TABLET | Freq: Three times a day (TID) | ORAL | 0 refills | Status: DC | PRN

## 2017-08-09 NOTE — Progress Notes (Signed)
Pt c/o back pain

## 2017-08-09 NOTE — Progress Notes (Signed)
Subjective:  Christine BachJasmine Graffam is a 22 y.o. G2P0010 at 767w3d being seen today for ongoing prenatal care.  She is currently monitored for the following issues for this low-risk pregnancy and has History of miscarriage, currently pregnant; Encounter for supervision of normal pregnancy; and Missed abortion on their problem list.  Patient reports backache and requesting refill for Flexeril.  Contractions: Not present. Vag. Bleeding: None.  Movement: Present. Denies leaking of fluid.   The following portions of the patient's history were reviewed and updated as appropriate: allergies, current medications, past family history, past medical history, past social history, past surgical history and problem list. Problem list updated.  Objective:   Vitals:   08/09/17 0941  BP: 131/75  Pulse: 95  Weight: 56.2 kg (124 lb)    Fetal Status: Fetal Heart Rate (bpm): 140 Fundal Height: 29 cm Movement: Present     General:  Alert, oriented and cooperative. Patient is in no acute distress.  Skin: Skin is warm and dry. No rash noted.   Cardiovascular: Normal heart rate noted  Respiratory: Normal respiratory effort, no problems with respiration noted  Abdomen: Soft, gravid, appropriate for gestational age. Pain/Pressure: Present     Pelvic: Vag. Bleeding: None Vag D/C Character: Thin   Cervical exam deferred        Extremities: Normal range of motion.  Edema: None  Mental Status: Normal mood and affect. Normal behavior. Normal judgment and thought content.   Urinalysis:      Assessment and Plan:  Pregnancy: G2P0010 at 687w3d  1. Encounter for supervision of normal pregnancy, antepartum, unspecified gravidity - 2Hr GTT w/ 1 Hr Carpenter 75 g - CBC - HIV antibody (with reflex) - RPR - interested in waterbirth-plans to take class  2. Back pain affecting pregnancy in third trimester - cyclobenzaprine (FLEXERIL) 5 MG tablet; Take 1 tablet (5 mg total) by mouth 3 (three) times daily as needed for muscle  spasms.  Dispense: 30 tablet; Refill: 0  Preterm labor symptoms and general obstetric precautions including but not limited to vaginal bleeding, contractions, leaking of fluid and fetal movement were reviewed in detail with the patient. Please refer to After Visit Summary for other counseling recommendations.  Return in about 2 weeks (around 08/23/2017).   Donette LarryBhambri, Linh Johannes, CNM

## 2017-08-12 LAB — CBC
HCT: 31.9 % — ABNORMAL LOW (ref 35.0–45.0)
Hemoglobin: 11.4 g/dL — ABNORMAL LOW (ref 11.7–15.5)
MCH: 33.5 pg — ABNORMAL HIGH (ref 27.0–33.0)
MCHC: 35.7 g/dL (ref 32.0–36.0)
MCV: 93.8 fL (ref 80.0–100.0)
MPV: 10 fL (ref 7.5–12.5)
PLATELETS: 344 10*3/uL (ref 140–400)
RBC: 3.4 10*6/uL — ABNORMAL LOW (ref 3.80–5.10)
RDW: 12.1 % (ref 11.0–15.0)
WBC: 13.7 10*3/uL — AB (ref 3.8–10.8)

## 2017-08-12 LAB — 2HR GTT W 1 HR, CARPENTER, 75 G
GLUCOSE, 1 HR, GEST: 150 mg/dL (ref 65–179)
GLUCOSE, 2 HR, GEST: 90 mg/dL (ref 65–152)
GLUCOSE, FASTING, GEST: 80 mg/dL (ref 65–91)

## 2017-08-12 LAB — RPR: RPR Ser Ql: NONREACTIVE

## 2017-08-12 LAB — HIV ANTIBODY (ROUTINE TESTING W REFLEX): HIV 1&2 Ab, 4th Generation: NONREACTIVE

## 2017-08-22 ENCOUNTER — Ambulatory Visit (INDEPENDENT_AMBULATORY_CARE_PROVIDER_SITE_OTHER): Payer: Medicaid Other | Admitting: Obstetrics & Gynecology

## 2017-08-22 VITALS — BP 132/75 | HR 98 | Temp 97.0°F | Wt 125.0 lb

## 2017-08-22 DIAGNOSIS — Z3403 Encounter for supervision of normal first pregnancy, third trimester: Secondary | ICD-10-CM

## 2017-08-22 NOTE — Progress Notes (Signed)
   PRENATAL VISIT NOTE  Subjective:  Christine Whitehead is a 22 y.o. G2P0010 at 2115w2d being seen today for ongoing prenatal care.  She is currently monitored for the following issues for this low-risk pregnancy and has History of miscarriage, currently pregnant; Encounter for supervision of normal pregnancy; and Missed abortion on their problem list.  Patient reports upper back pain for about a month and feeling lightheaded at times..  Contractions: Not present. Vag. Bleeding: None.  Movement: Present. Denies leaking of fluid.   The following portions of the patient's history were reviewed and updated as appropriate: allergies, current medications, past family history, past medical history, past social history, past surgical history and problem list. Problem list updated.  Objective:   Vitals:   08/22/17 1603  BP: 132/75  Pulse: 98  Temp: (!) 97 F (36.1 C)  Weight: 125 lb (56.7 kg)    Fetal Status: Fetal Heart Rate (bpm): 143   Movement: Present     General:  Alert, oriented and cooperative. Patient is in no acute distress.  Skin: Skin is warm and dry. No rash noted.   Cardiovascular: Normal heart rate noted  Respiratory: Normal respiratory effort, no problems with respiration noted  Abdomen: Soft, gravid, appropriate for gestational age.  Pain/Pressure: Present     Pelvic: Cervical exam deferred        Extremities: Normal range of motion.  Edema: None  Mental Status:  Normal mood and affect. Normal behavior. Normal judgment and thought content.   Assessment and Plan:  Pregnancy: G2P0010 at 5815w2d  1. Encounter for supervision of normal first pregnancy in third trimester Check CBC Rec chiropractor  Preterm labor symptoms and general obstetric precautions including but not limited to vaginal bleeding, contractions, leaking of fluid and fetal movement were reviewed in detail with the patient. Please refer to After Visit Summary for other counseling recommendations.  No Follow-up on  file.   Allie BossierMyra C Gethsemane Fischler, MD

## 2017-08-22 NOTE — Progress Notes (Signed)
PT has had back pain for the last few days that has started improving today however, she is experiencing nausea and feeling lightheaded.

## 2017-08-23 ENCOUNTER — Encounter: Payer: Medicaid Other | Admitting: Advanced Practice Midwife

## 2017-08-23 LAB — CBC
HCT: 30.5 % — ABNORMAL LOW (ref 35.0–45.0)
Hemoglobin: 10.8 g/dL — ABNORMAL LOW (ref 11.7–15.5)
MCH: 32.5 pg (ref 27.0–33.0)
MCHC: 35.4 g/dL (ref 32.0–36.0)
MCV: 91.9 fL (ref 80.0–100.0)
MPV: 10.5 fL (ref 7.5–12.5)
PLATELETS: 285 10*3/uL (ref 140–400)
RBC: 3.32 10*6/uL — AB (ref 3.80–5.10)
RDW: 12.2 % (ref 11.0–15.0)
WBC: 13.4 10*3/uL — ABNORMAL HIGH (ref 3.8–10.8)

## 2017-08-26 ENCOUNTER — Telehealth: Payer: Self-pay

## 2017-08-26 NOTE — Telephone Encounter (Signed)
Tried to contact patient to let her know that Dr.Dove said her results showed that she is mildly anemic and she should take an Iron pill daily. Pt did not answer and voicemail is not set up. I will send her a message through MyChart.

## 2017-08-29 ENCOUNTER — Telehealth: Payer: Self-pay

## 2017-08-29 NOTE — Telephone Encounter (Signed)
Pt called stating that she is [redacted] weeks pregnant and is "leaking water". I spoke with Mariel AloeLora Clark, RN and she said to [facility] the pt [facility]. Pt expressed understanding.

## 2017-09-05 ENCOUNTER — Ambulatory Visit (INDEPENDENT_AMBULATORY_CARE_PROVIDER_SITE_OTHER): Payer: Medicaid Other | Admitting: Obstetrics and Gynecology

## 2017-09-05 ENCOUNTER — Encounter: Payer: Self-pay | Admitting: Obstetrics and Gynecology

## 2017-09-05 VITALS — BP 137/81 | HR 109 | Wt 130.0 lb

## 2017-09-05 DIAGNOSIS — Z3403 Encounter for supervision of normal first pregnancy, third trimester: Secondary | ICD-10-CM

## 2017-09-05 NOTE — Progress Notes (Signed)
   PRENATAL VISIT NOTE  Subjective:  Christine Whitehead is a 22 y.o. G2P0010 at 1837w2d being seen today for ongoing prenatal care.  She is currently monitored for the following issues for this low-risk pregnancy and has History of miscarriage, currently pregnant; Encounter for supervision of normal pregnancy; and Missed abortion on their problem list.  Patient reports no complaints.  Contractions: Irritability. Vag. Bleeding: None.  Movement: Present. Denies leaking of fluid.   The following portions of the patient's history were reviewed and updated as appropriate: allergies, current medications, past family history, past medical history, past social history, past surgical history and problem list. Problem list updated.  Objective:   Vitals:   09/05/17 1607  BP: 137/81  Pulse: (!) 109  Weight: 130 lb (59 kg)    Fetal Status: Fetal Heart Rate (bpm): 148 Fundal Height: 32 cm Movement: Present     General:  Alert, oriented and cooperative. Patient is in no acute distress.  Skin: Skin is warm and dry. No rash noted.   Cardiovascular: Normal heart rate noted  Respiratory: Normal respiratory effort, no problems with respiration noted  Abdomen: Soft, gravid, appropriate for gestational age.  Pain/Pressure: Present     Pelvic: Cervical exam deferred        Extremities: Normal range of motion.  Edema: None  Mental Status:  Normal mood and affect. Normal behavior. Normal judgment and thought content.   Assessment and Plan:  Pregnancy: G2P0010 at 5337w2d  1. Encounter for supervision of normal first pregnancy in third trimester Patient is doing well without complaints Back pain improving with maternity belt Patient scheduled for water birth on 2/20    Preterm labor symptoms and general obstetric precautions including but not limited to vaginal bleeding, contractions, leaking of fluid and fetal movement were reviewed in detail with the patient. Please refer to After Visit Summary for other  counseling recommendations.  Return in about 2 weeks (around 09/19/2017) for ROB.   Catalina AntiguaPeggy Jakelyn Squyres, MD

## 2017-09-18 ENCOUNTER — Ambulatory Visit (INDEPENDENT_AMBULATORY_CARE_PROVIDER_SITE_OTHER): Payer: Medicaid Other

## 2017-09-18 VITALS — BP 129/80 | HR 107 | Wt 131.0 lb

## 2017-09-18 DIAGNOSIS — Z3403 Encounter for supervision of normal first pregnancy, third trimester: Secondary | ICD-10-CM

## 2017-09-18 NOTE — Progress Notes (Signed)
   PRENATAL VISIT NOTE  Subjective:  Christine Whitehead is a 22 y.o. G2P0010 at 5048w1d being seen today for ongoing prenatal care.  She is currently monitored for the following issues for this low-risk pregnancy and has History of miscarriage, currently pregnant; Encounter for supervision of normal pregnancy; and Missed abortion on their problem list.  Patient reports She would like a cervical check as she had some preterm contractions on 09-12-17. [facility] so she was seen in their ER and was told that her cervix was 2cm dilated. She was hydrated and sent home. She has not had sex since then. She denies regular contractions, but does have mucous daily..  Contractions: Irregular. Vag. Bleeding: None.  Movement: Present. Denies leaking of fluid.   The following portions of the patient's history were reviewed and updated as appropriate: allergies, current medications, past family history, past medical history, past social history, past surgical history and problem list. Problem list updated.  Objective:   Vitals:   09/18/17 1115  BP: 129/80  Pulse: (!) 107  Weight: 131 lb (59.4 kg)    Fetal Status: Fetal Heart Rate (bpm): 153   Movement: Present     General:  Alert, oriented and cooperative. Patient is in no acute distress.  Skin: Skin is warm and dry. No rash noted.   Cardiovascular: Normal heart rate noted  Respiratory: Normal respiratory effort, no problems with respiration noted  Abdomen: Soft, gravid, appropriate for gestational age.  Pain/Pressure: Present     Pelvic: Cervical exam performed        Extremities: Normal range of motion.  Edema: None  Mental Status:  Normal mood and affect. Normal behavior. Normal judgment and thought content.   Assessment and Plan:  Pregnancy: G2P0010 at 8748w1d  1. Encounter for supervision of normal first pregnancy in third trimester I have rec'd pelvic rest. Cervical cultures at next visit.  Preterm labor  symptoms and general obstetric precautions including but not limited to vaginal bleeding, contractions, leaking of fluid and fetal movement were reviewed in detail with the patient. Please refer to After Visit Summary for other counseling recommendations.  Return in about 1 week (around 09/25/2017) for cervical cultures.   Allie BossierMyra C Cathan Gearin, MD

## 2017-09-27 ENCOUNTER — Other Ambulatory Visit (HOSPITAL_COMMUNITY)
Admission: RE | Admit: 2017-09-27 | Discharge: 2017-09-27 | Disposition: A | Discharge status: Home / Self Care | Payer: Medicaid Other | Source: Ambulatory Visit | Attending: Advanced Practice Midwife | Admitting: Advanced Practice Midwife

## 2017-09-27 ENCOUNTER — Encounter: Payer: Self-pay | Admitting: Advanced Practice Midwife

## 2017-09-27 ENCOUNTER — Ambulatory Visit (INDEPENDENT_AMBULATORY_CARE_PROVIDER_SITE_OTHER): Payer: Medicaid Other | Admitting: Advanced Practice Midwife

## 2017-09-27 VITALS — BP 119/74 | HR 103 | Wt 137.0 lb

## 2017-09-27 DIAGNOSIS — Z3400 Encounter for supervision of normal first pregnancy, unspecified trimester: Secondary | ICD-10-CM | POA: Diagnosis present

## 2017-09-27 DIAGNOSIS — Z3A36 36 weeks gestation of pregnancy: Secondary | ICD-10-CM | POA: Insufficient documentation

## 2017-09-27 DIAGNOSIS — Z3483 Encounter for supervision of other normal pregnancy, third trimester: Secondary | ICD-10-CM | POA: Insufficient documentation

## 2017-09-27 DIAGNOSIS — Z3403 Encounter for supervision of normal first pregnancy, third trimester: Secondary | ICD-10-CM

## 2017-09-27 LAB — OB RESULTS CONSOLE GBS: STREP GROUP B AG: NEGATIVE

## 2017-09-27 NOTE — Patient Instructions (Signed)
Considering Waterbirth? Guide for patients at [facility]  Why consider waterbirth?  . Gentle birth for babies . Less pain medicine used in labor . May allow for passive descent/less pushing . May reduce perineal tears  . More mobility and instinctive maternal position changes . Increased maternal relaxation . Reduced blood pressure in labor  Is waterbirth safe? What are the risks of infection, drowning or other complications?  . Infection: o Very low risk (3.7 % for tub vs 4.8% for bed) o 7 in 8000 waterbirths with documented infection o Poorly cleaned equipment most common cause o Slightly lower group B strep transmission rate  . Drowning o Maternal:  - Very low risk   - Related to seizures or fainting o Newborn:  - Very low risk. No evidence of increased risk of respiratory problems in multiple large studies - Physiological protection from breathing under water - Avoid underwater birth if there are any fetal complications - Once baby's head is out of the water, keep it out.  . Birth complication o Some reports of cord trauma, but risk decreased by bringing baby to surface gradually o No evidence of increased risk of shoulder dystocia. Mothers can usually change positions faster in water than in a bed, possibly aiding the maneuvers to free the shoulder.   You must attend a Doren Custard class at [facility]  3rd Wednesday of every month from 7-9pm  [employer] by calling [phone] or online at [email or web address]  Bring [place] the certificate from the class to your prenatal appointment  Meet with a midwife at 36 weeks to see if you can still plan a waterbirth and to sign the consent.   Purchase or rent the following supplies:   Water Birth Pool (Birth Pool in a Box or [place] for instance)  (Tubs start ~$125)  Single-use disposable tub liner designed for your brand of tub  New garden hose labeled "lead-free", "suitable for drinking  water",  Electric drain pump to remove water (We recommend 792 gallon per hour or greater pump.)   Separate garden hose to remove the dirty water  Fish net  Bathing suit top (optional)  Long-handled mirror (optional)  Places to purchase or rent supplies  GotWebTools.is for tub purchases and supplies  Waterbirthsolutions.com for tub purchases and supplies  The Labor Ladies (www.thelaborladies.com) $275 for tub rental/set-up & take down/kit   [employer] (http://www.fleming.com/.htm) Information regarding doulas (labor support) who provide pool rentals  Our practice has a Birth Pool in a Box tub at the hospital that you may borrow on a first-come-first-served basis. It is your responsibility to to set up, clean and break down the tub. We cannot guarantee the availability of this tub in advance. You are responsible for bringing all accessories listed above. If you do not have all necessary supplies you cannot have a waterbirth.    Things that would prevent you from having a waterbirth:  Premature, <37wks  Previous cesarean birth  Presence of thick meconium-stained fluid  Multiple gestation (Twins, triplets, etc.)  Uncontrolled diabetes or gestational diabetes requiring medication  Hypertension requiring medication or diagnosis of pre-eclampsia  Heavy vaginal bleeding  Non-reassuring fetal heart rate  Active infection (MRSA, etc.). Group B Strep is NOT a contraindication for  waterbirth.  If your labor has to be induced and induction method requires continuous  monitoring of the baby's heart rate  Other risks/issues identified by your obstetrical provider  Please remember that birth is unpredictable. Under certain unforeseeable  circumstances your provider may advise against giving birth in the tub. These decisions will be made on a case-by-case basis and with the safety of you and your baby as our highest priority.

## 2017-09-27 NOTE — Progress Notes (Signed)
   PRENATAL VISIT NOTE  Subjective:  Christine Whitehead is a 22 y.o. G2P0010 at 9539w3d being seen today for ongoing prenatal care.  She is currently monitored for the following issues for this low-risk pregnancy and has History of miscarriage, currently pregnant; Encounter for supervision of normal pregnancy; and Missed abortion on their problem list.  Patient reports occasional contractions.  Contractions: Irritability. Vag. Bleeding: None.  Movement: Present. Denies leaking of fluid.   The following portions of the patient's history were reviewed and updated as appropriate: allergies, current medications, past family history, past medical history, past social history, past surgical history and problem list. Problem list updated.  Objective:   Vitals:   09/27/17 1011  BP: 119/74  Pulse: (!) 103  Weight: 137 lb (62.1 kg)    Fetal Status: Fetal Heart Rate (bpm): 159 Fundal Height: 36 cm Movement: Present  Presentation: Vertex  General:  Alert, oriented and cooperative. Patient is in no acute distress.  Skin: Skin is warm and dry. No rash noted.   Cardiovascular: Normal heart rate noted  Respiratory: Normal respiratory effort, no problems with respiration noted  Abdomen: Soft, gravid, appropriate for gestational age.  Pain/Pressure: Present     Pelvic: Cervical exam performed Dilation: 1 Effacement (%): 50 Station: -3  Extremities: Normal range of motion.  Edema: Trace  Mental Status:  Normal mood and affect. Normal behavior. Normal judgment and thought content.   Assessment and Plan:  Pregnancy: G2P0010 at 5939w3d  1. Supervision of normal first pregnancy, antepartum  - Urine cytology ancillary only - Culture, beta strep (group b only) - Waterbirth consent signed  Term labor symptoms and general obstetric precautions including but not limited to vaginal bleeding, contractions, leaking of fluid and fetal movement were reviewed in detail with the patient. Please refer to After Visit  Summary for other counseling recommendations.  F/U 1  week  [place] Jowan Skillin, [place]

## 2017-09-29 LAB — CULTURE, BETA STREP (GROUP B ONLY)
MICRO NUMBER:: 90268233
SPECIMEN QUALITY: ADEQUATE

## 2017-09-30 LAB — URINE CYTOLOGY ANCILLARY ONLY
Chlamydia: NEGATIVE
Neisseria Gonorrhea: NEGATIVE

## 2017-10-04 ENCOUNTER — Encounter (HOSPITAL_COMMUNITY): Payer: Self-pay | Admitting: *Deleted

## 2017-10-04 ENCOUNTER — Ambulatory Visit (INDEPENDENT_AMBULATORY_CARE_PROVIDER_SITE_OTHER): Payer: Medicaid Other

## 2017-10-04 ENCOUNTER — Inpatient Hospital Stay (HOSPITAL_COMMUNITY)
Admission: AD | Admit: 2017-10-04 | Discharge: 2017-10-04 | Disposition: A | Discharge status: Home / Self Care | Payer: Medicaid Other | Source: Ambulatory Visit | Attending: Obstetrics and Gynecology | Admitting: Obstetrics and Gynecology

## 2017-10-04 DIAGNOSIS — Z3A37 37 weeks gestation of pregnancy: Secondary | ICD-10-CM | POA: Insufficient documentation

## 2017-10-04 DIAGNOSIS — O479 False labor, unspecified: Secondary | ICD-10-CM

## 2017-10-04 DIAGNOSIS — O471 False labor at or after 37 completed weeks of gestation: Secondary | ICD-10-CM | POA: Diagnosis present

## 2017-10-04 DIAGNOSIS — Z3400 Encounter for supervision of normal first pregnancy, unspecified trimester: Secondary | ICD-10-CM

## 2017-10-04 LAB — POCT FERN TEST

## 2017-10-04 NOTE — Progress Notes (Signed)
   PRENATAL VISIT NOTE  Subjective:  Christine BachJasmine Whitehead is a 22 y.o. G2P0010 at 10781w3d being seen today for ongoing prenatal care.  She is currently monitored for the following issues for this low-risk pregnancy and has History of miscarriage, currently pregnant; Encounter for supervision of normal pregnancy; and Missed abortion on their problem list.  Patient reports contractions and discharge for 2 days. She reports contractions every 2 minutes since 0730. She reports a mucous discharge.   Contractions: Regular. Vag. Bleeding: None.  Movement: Present. Denies leaking of fluid.   The following portions of the patient's history were reviewed and updated as appropriate: allergies, current medications, past family history, past medical history, past social history, past surgical history and problem list. Problem list updated.  Objective:  There were no vitals filed for this visit.  Fetal Status:   Fundal Height: 39 cm Movement: Present  Presentation: Vertex  General:  Alert, oriented and cooperative. Patient is in no acute distress.  Skin: Skin is warm and dry. No rash noted.   Cardiovascular: Normal heart rate noted  Respiratory: Normal respiratory effort, no problems with respiration noted  Abdomen: Soft, gravid, appropriate for gestational age.  Pain/Pressure: Present     Pelvic: Cervical exam performed Dilation: 1 Effacement (%): 50 Station: -1  Sterile speculum exam: No pooling noted, nitrazine negative  Extremities: Normal range of motion.  Edema: None  Mental Status:  Normal mood and affect. Normal behavior. Normal judgment and thought content.   Assessment and Plan:  Pregnancy: G2P0010 at 4381w3d  1. Supervision of normal first pregnancy, antepartum -Early labor. When to go to hospital reviewed with patient. Patient lives 45 minutes away and concerned about the drive. Encour[place] until contractions ease or labor begins. -GBS neg reviewed with  patient.  Term labor symptoms and general obstetric precautions including but not limited to vaginal bleeding, contractions, leaking of fluid and fetal movement were reviewed in detail with the patient. Please refer to After Visit Summary for other counseling recommendations.  Return in about 1 week (around 10/11/2017) for Return OB visit.  Rolm BookbinderCaroline M Emmery Seiler, CNM  10/04/17 10:27 AM

## 2017-10-04 NOTE — Discharge Instructions (Signed)
Braxton Hicks Contractions °Contractions of the uterus can occur throughout pregnancy, but they are not always a sign that you are in labor. You may have practice contractions called Braxton Hicks contractions. These false labor contractions are sometimes confused with true labor. °What are Braxton Hicks contractions? °Braxton Hicks contractions are tightening movements that occur in the muscles of the uterus before labor. Unlike true labor contractions, these contractions do not result in opening (dilation) and thinning of the cervix. Toward the end of pregnancy (32-34 weeks), Braxton Hicks contractions can happen more often and may become stronger. These contractions are sometimes difficult to tell apart from true labor because they can be very uncomfortable. You should not feel embarrassed if you go to the hospital with false labor. °Sometimes, the only way to tell if you are in true labor is for your health care provider to look for changes in the cervix. The health care provider will do a physical exam and may monitor your contractions. If you are not in true labor, the exam should show that your cervix is not dilating and your water has not broken. °If there are other health problems associated with your pregnancy, it is completely safe for you to be sent home with false labor. You may continue to have Braxton Hicks contractions until you go into true labor. °How to tell the difference between true labor and false labor °True labor °· Contractions last 30-70 seconds. °· Contractions become very regular. °· Discomfort is usually felt in the top of the uterus, and it spreads to the lower abdomen and low back. °· Contractions do not go away with walking. °· Contractions usually become more intense and increase in frequency. °· The cervix dilates and gets thinner. °False labor °· Contractions are usually shorter and not as strong as true labor contractions. °· Contractions are usually irregular. °· Contractions  are often felt in the front of the lower abdomen and in the groin. °· Contractions may go away when you walk around or change positions while lying down. °· Contractions get weaker and are shorter-lasting as time goes on. °· The cervix usually does not dilate or become thin. °Follow these instructions at home: °· Take over-the-counter and prescription medicines only as told by your health care provider. °· Keep up with your usual exercises and follow other instructions from your health care provider. °· Eat and drink lightly if you think you are going into labor. °· If Braxton Hicks contractions are making you uncomfortable: °? Change your position from lying down or resting to walking, or change from walking to resting. °? Sit and rest in a tub of warm water. °? Drink enough fluid to keep your urine pale yellow. Dehydration may cause these contractions. °? Do slow and deep breathing several times an hour. °· Keep all follow-up prenatal visits as told by your health care provider. This is important. °Contact a health care provider if: °· You have a fever. °· You have continuous pain in your abdomen. °Get help right away if: °· Your contractions become stronger, more regular, and closer together. °· You have fluid leaking or gushing from your vagina. °· You pass blood-tinged mucus (bloody show). °· You have bleeding from your vagina. °· You have low back pain that you never had before. °· You feel your baby’s head pushing down and causing pelvic pressure. °· Your baby is not moving inside you as much as it used to. °Summary °· Contractions that occur before labor are called Braxton   Hicks contractions, false labor, or practice contractions. °· Braxton Hicks contractions are usually shorter, weaker, farther apart, and less regular than true labor contractions. True labor contractions usually become progressively stronger and regular and they become more frequent. °· Manage discomfort from Braxton Hicks contractions by  changing position, resting in a warm bath, drinking plenty of water, or practicing deep breathing. °This information is not intended to replace advice given to you by your health care provider. Make sure you discuss any questions you have with your health care provider. °Document Released: 11/29/2016 Document Revised: 11/29/2016 Document Reviewed: 11/29/2016 °Elsevier Interactive Patient Education © 2018 [employer] ° °

## 2017-10-04 NOTE — Patient Instructions (Signed)
Fetal Movement Counts Patient Name: ________________________________________________ Patient Due Date: ____________________ What is a fetal movement count? A fetal movement count is the number of times that you feel your baby move during a certain amount of time. This may also be called a fetal kick count. A fetal movement count is recommended for every pregnant woman. You may be asked to start counting fetal movements as early as week 28 of your pregnancy. Pay attention to when your baby is most active. You may notice your baby's sleep and wake cycles. You may also notice things that make your baby move more. You should do a fetal movement count:  When your baby is normally most active.  At the same time each day.  A good time to count movements is while you are resting, after having something to eat and drink. How do I count fetal movements? 1. Find a quiet, comfortable area. Sit, or lie down on your side. 2. Write down the date, the start time and stop time, and the number of movements that you felt between those two times. Take this information with you to your health care visits. 3. For 2 hours, count kicks, flutters, swishes, rolls, and jabs. You should feel at least 10 movements during 2 hours. 4. You may stop counting after you have felt 10 movements. 5. If you do not feel 10 movements in 2 hours, have something to eat and drink. Then, keep resting and counting for 1 hour. If you feel at least 4 movements during that hour, you may stop counting. Contact a health care provider if:  You feel fewer than 4 movements in 2 hours.  Your baby is not moving like he or she usually does. Date: ____________ Start time: ____________ Stop time: ____________ Movements: ____________ Date: ____________ Start time: ____________ Stop time: ____________ Movements: ____________ Date: ____________ Start time: ____________ Stop time: ____________ Movements: ____________ Date: ____________ Start time:  ____________ Stop time: ____________ Movements: ____________ Date: ____________ Start time: ____________ Stop time: ____________ Movements: ____________ Date: ____________ Start time: ____________ Stop time: ____________ Movements: ____________ Date: ____________ Start time: ____________ Stop time: ____________ Movements: ____________ Date: ____________ Start time: ____________ Stop time: ____________ Movements: ____________ Date: ____________ Start time: ____________ Stop time: ____________ Movements: ____________ This information is not intended to replace advice given to you by your health care provider. Make sure you discuss any questions you have with your health care provider. Document Released: 08/15/2006 Document Revised: 03/14/2016 Document Reviewed: 08/25/2015 Elsevier Interactive Patient Education  2018 [employer] of the uterus can occur throughout pregnancy, but they are not always a sign that you are in labor. You may have practice contractions called Braxton Hicks contractions. These false labor contractions are sometimes confused with true labor. What are [place] contractions? Braxton Hicks contractions are tightening movements that occur in the muscles of the uterus before labor. Unlike true labor contractions, these contractions do not result in opening (dilation) and thinning of the cervix. Toward the end of pregnancy (32-34 weeks), Braxton Hicks contractions can happen more often and may become stronger. These contractions are sometimes difficult to tell apart from true labor because they can be very uncomfortable. You should not feel embarrassed if you go to the hospital with false labor. Sometimes, the only way to tell if you are in true labor is for your health care provider to look for changes in the cervix. The health care provider will do a physical exam and may monitor your contractions. If  you are not in true labor, the exam  should show that your cervix is not dilating and your water has not broken. If there are other health problems associated with your pregnancy, it is completely safe for you to be sent home with false labor. You may continue to have Braxton Hicks contractions until you go into true labor. How to tell the difference between true labor and false labor True labor  Contractions last 30-70 seconds.  Contractions become very regular.  Discomfort is usually felt in the top of the uterus, and it spreads to the lower abdomen and low back.  Contractions do not go away with walking.  Contractions usually become more intense and increase in frequency.  The cervix dilates and gets thinner. False labor  Contractions are usually shorter and not as strong as true labor contractions.  Contractions are usually irregular.  Contractions are often felt in the front of the lower abdomen and in the groin.  Contractions may go away when you walk around or change positions while lying down.  Contractions get weaker and are shorter-lasting as time goes on.  The cervix usually does not dilate or become thin. Follow these instructions at home:  Take over-the-counter and prescription medicines only as told by your health care provider.  Keep up with your usual exercises and follow other instructions from your health care provider.  Eat and drink lightly if you think you are going into labor.  If Braxton Hicks contractions are making you uncomfortable: ? Change your position from lying down or resting to walking, or change from walking to resting. ? Sit and rest in a tub of warm water. ? Drink enough fluid to keep your urine pale yellow. Dehydration may cause these contractions. ? Do slow and deep breathing several times an hour.  Keep all follow-up prenatal visits as told by your health care provider. This is important. Contact a health care provider if:  You have a fever.  You have continuous pain  in your abdomen. Get help right away if:  Your contractions become stronger, more regular, and closer together.  You have fluid leaking or gushing from your vagina.  You pass blood-tinged mucus (bloody show).  You have bleeding from your vagina.  You have low back pain that you never had before.  You feel your baby's head pushing down and causing pelvic pressure.  Your baby is not moving inside you as much as it used to. Summary  Contractions that occur before labor are called Braxton Hicks contractions, false labor, or practice contractions.  Braxton Hicks contractions are usually shorter, weaker, farther apart, and less regular than true labor contractions. True labor contractions usually become progressively stronger and regular and they become more frequent.  Manage discomfort from Braxton Hicks contractions by changing position, resting in a warm bath, drinking plenty of water, or practicing deep breathing. This information is not intended to replace advice given to you by your health care provider. Make sure you discuss any questions you have with your health care provider. Document Released: 11/29/2016 Document Revised: 11/29/2016 Document Reviewed: 11/29/2016 Elsevier Interactive Patient Education  2018 Elsevier Inc.  Safe Medications in Pregnancy   Acne: Benzoyl Peroxide Salicylic Acid  Backache/Headache: Tylenol: 2 regular strength every 4 hours OR              2 Extra strength every 6 hours  Colds/Coughs/Allergies: Benadryl (alcohol free) 25 mg every 6 hours as needed Breath right strips Claritin Cepacol throat lozenges Chloraseptic   throat spray Cold-Eeze- up to three times per day Cough drops, alcohol free Flonase (by prescription only) Guaifenesin Mucinex Robitussin DM (plain only, alcohol free) Saline nasal spray/drops Sudafed (pseudoephedrine) & Actifed ** use only after [redacted] weeks gestation and if you do not have high blood pressure Tylenol Vicks  Vaporub Zinc lozenges Zyrtec   Constipation: Colace Ducolax suppositories Fleet enema Glycerin suppositories Metamucil Milk of magnesia Miralax Senokot Smooth move tea  Diarrhea: Kaopectate Imodium A-D  *NO pepto Bismol  Hemorrhoids: Anusol Anusol HC Preparation H Tucks  Indigestion: Tums Maalox Mylanta Zantac  Pepcid  Insomnia: Benadryl (alcohol free) 25mg  every 6 hours as needed Tylenol PM Unisom, no Gelcaps  Leg Cramps: Tums MagGel  Nausea/Vomiting:  Bonine Dramamine Emetrol Ginger extract Sea bands Meclizine  Nausea medication to take during pregnancy:  Unisom (doxylamine succinate 25 mg tablets) Take one tablet daily at bedtime. If symptoms are not adequately controlled, the dose can be increased to a maximum recommended dose of two tablets daily (1/2 tablet in the morning, 1/2 tablet mid-afternoon and one at bedtime). Vitamin B6 100mg  tablets. Take one tablet twice a day (up to 200 mg per day).  Skin Rashes: Aveeno products Benadryl cream or 25mg  every 6 hours as needed Calamine Lotion 1% cortisone cream  Yeast infection: Gyne-lotrimin 7 Monistat 7   **If taking multiple medications, please check labels to avoid duplicating the same active ingredients **take medication as directed on the label ** Do not exceed 4000 mg of tylenol in 24 hours **Do not take medications that contain aspirin or ibuprofen

## 2017-10-08 ENCOUNTER — Telehealth: Payer: Self-pay

## 2017-10-08 NOTE — Telephone Encounter (Signed)
Pt ph [phone] pt req ret call for stress and emotional upset. Pt req nurse to call her back.

## 2017-10-10 ENCOUNTER — Encounter: Payer: Self-pay | Admitting: Advanced Practice Midwife

## 2017-10-11 ENCOUNTER — Ambulatory Visit (INDEPENDENT_AMBULATORY_CARE_PROVIDER_SITE_OTHER): Payer: Medicaid Other | Admitting: Certified Nurse Midwife

## 2017-10-11 VITALS — BP 138/87 | HR 130 | Wt 140.0 lb

## 2017-10-11 DIAGNOSIS — Z3403 Encounter for supervision of normal first pregnancy, third trimester: Secondary | ICD-10-CM

## 2017-10-12 NOTE — Progress Notes (Signed)
Subjective:  Christine Whitehead is a 22 y.o. G2P0010 at 7339w4d being seen today for ongoing prenatal care.  She is currently monitored for the following issues for this low-risk pregnancy and has History of miscarriage, currently pregnant and Encounter for supervision of normal pregnancy on their problem list.  Patient reports ctx q2-3 min x1 week. Very uncomfortable. Stating "someone needs to do something today".. She also states "nobody is checking the size of his head, he's probably going to be too big for me".  Contractions: Irregular. Vag. Bleeding: None.  Movement: Present. Denies leaking of fluid.   The following portions of the patient's history were reviewed and updated as appropriate: allergies, current medications, past family history, past medical history, past social history, past surgical history and problem list. Problem list updated.  Objective:   Vitals:   10/11/17 1050  BP: 138/87  Pulse: (!) 130  Weight: 140 lb (63.5 kg)    Fetal Status: Fetal Heart Rate (bpm): 155 Fundal Height: 37 cm Movement: Present  Presentation: Vertex  General:  Alert, oriented and cooperative. Patient is in no acute distress.  Skin: Skin is warm and dry. No rash noted.   Cardiovascular: Normal heart rate noted  Respiratory: Normal respiratory effort, no problems with respiration noted  Abdomen: Soft, gravid, appropriate for gestational age. Pain/Pressure: Present     Pelvic: Vag. Bleeding: None Vag D/C Character: Mucous   Cervical exam performed Dilation: 2 Effacement (%): 70 Station: -1  Extremities: Normal range of motion.  Edema: None  Mental Status: Normal mood and affect. Normal behavior. Normal judgment and thought content.   Urinalysis:      Assessment and Plan:  Pregnancy: G2P0010 at 6639w4d  1. Supervision of normal pregnancy -discussed labor ctx vs BH ctx>no signs of labor at this time-may be in prodromal labor -don't recommend IOL or membrane sweep as she is only 38 wks and  interventions would increase her risk for other complications, could consider membrane sweep next week if not delivered -offered her something to help her rest better and she declined -FH 37 wks, EFW by [place] 7 lbs, macrosomia unlikely  Term labor symptoms and general obstetric precautions including but not limited to vaginal bleeding, contractions, leaking of fluid and fetal movement were reviewed in detail with the patient. Please refer to After Visit Summary for other counseling recommendations.  Return in about 1 week (around 10/18/2017).   Donette LarryBhambri, Lilymarie Scroggins, CNM

## 2017-10-14 ENCOUNTER — Ambulatory Visit (INDEPENDENT_AMBULATORY_CARE_PROVIDER_SITE_OTHER): Payer: Medicaid Other | Admitting: Obstetrics & Gynecology

## 2017-10-14 ENCOUNTER — Encounter: Payer: Medicaid Other | Admitting: Advanced Practice Midwife

## 2017-10-14 VITALS — BP 130/73 | HR 108 | Wt 140.0 lb

## 2017-10-14 DIAGNOSIS — Z3403 Encounter for supervision of normal first pregnancy, third trimester: Secondary | ICD-10-CM | POA: Diagnosis not present

## 2017-10-14 NOTE — Progress Notes (Signed)
   PRENATAL VISIT NOTE  Subjective:  Christine Whitehead is a 22 y.o. G2P0010 at 5677w6d being seen today for ongoing prenatal care.  She is currently monitored for the following issues for this low-risk pregnancy and has History of miscarriage, currently pregnant and Encounter for supervision of normal pregnancy on their problem list.  Patient reports no complaints.  Contractions: Irregular. Vag. Bleeding: None.  Movement: Absent. Denies leaking of fluid.   The following portions of the patient's history were reviewed and updated as appropriate: allergies, current medications, past family history, past medical history, past social history, past surgical history and problem list. Problem list updated.  Objective:   Vitals:   10/14/17 1545  BP: 130/73  Pulse: (!) 108  Weight: 140 lb (63.5 kg)    Fetal Status: Fetal Heart Rate (bpm): 148   Movement: Absent     General:  Alert, oriented and cooperative. Patient is in no acute distress.  Skin: Skin is warm and dry. No rash noted.   Cardiovascular: Normal heart rate noted  Respiratory: Normal respiratory effort, no problems with respiration noted  Abdomen: Soft, gravid, appropriate for gestational age.  Pain/Pressure: Present     Pelvic: Cervical exam performed       membranes swept  Extremities: Normal range of motion.  Edema: None  Mental Status:  Normal mood and affect. Normal behavior. Normal judgment and thought content.   Assessment and Plan:  Pregnancy: G2P0010 at 4577w6d  1. Encounter for supervision of normal first pregnancy in third trimester   Term labor symptoms and general obstetric precautions including but not limited to vaginal bleeding, contractions, leaking of fluid and fetal movement were reviewed in detail with the patient. Please refer to After Visit Summary for other counseling recommendations.  No Follow-up on file.   Allie BossierMyra C Lelah Rennaker, MD

## 2017-10-15 ENCOUNTER — Inpatient Hospital Stay (HOSPITAL_COMMUNITY)
Admission: AD | Admit: 2017-10-15 | Discharge: 2017-10-15 | Disposition: A | Discharge status: Home / Self Care | Payer: Medicaid Other | Source: Ambulatory Visit | Attending: Obstetrics and Gynecology | Admitting: Obstetrics and Gynecology

## 2017-10-15 ENCOUNTER — Encounter (HOSPITAL_COMMUNITY): Payer: Self-pay

## 2017-10-15 ENCOUNTER — Other Ambulatory Visit: Payer: Self-pay

## 2017-10-15 DIAGNOSIS — O479 False labor, unspecified: Secondary | ICD-10-CM | POA: Insufficient documentation

## 2017-10-15 NOTE — MAU Note (Signed)
Pt reports contractions for 6 hours, some blood on tissue when she wipes.

## 2017-10-15 NOTE — MAU Note (Signed)
OK for pt.to ambulating hallway for one hour, per provider.

## 2017-10-15 NOTE — MAU Note (Signed)
..   I have communicated with Dr. Linwood Dibblesumball and reviewed vital signs:  Vitals:   10/15/17 1330 10/15/17 1544  BP:  117/76  Pulse:  (!) 122  Resp:    Temp:    SpO2: 99%     Vaginal exam:  Dilation: 4 Effacement (%): 70 Cervical Position: Middle Station: -3 Presentation: Vertex Exam by:: Stevie Ertle, RN ,   Also reviewed contraction pattern and that non-stress test is reactive.  It has been documented that patient is contracting every 2-6 minutes with no cervical change over 2 hours not indicating active labor.  Patient denies any other complaints.  Based on this report provider has given order for discharge.  A discharge order and diagnosis entered by a provider.   Labor discharge instructions reviewed with patient.

## 2017-12-12 ENCOUNTER — Emergency Department
Admission: EM | Admit: 2017-12-12 | Discharge: 2017-12-12 | Disposition: A | Discharge status: Home / Self Care | Payer: Medicaid Other | Source: Home / Self Care | Attending: Family Medicine | Admitting: Family Medicine

## 2017-12-12 ENCOUNTER — Encounter: Payer: Self-pay | Admitting: Emergency Medicine

## 2017-12-12 ENCOUNTER — Other Ambulatory Visit: Payer: Self-pay

## 2017-12-12 DIAGNOSIS — L03116 Cellulitis of left lower limb: Secondary | ICD-10-CM | POA: Diagnosis not present

## 2017-12-12 MED ORDER — DOXYCYCLINE HYCLATE 100 MG PO CAPS: 100.0000 mg | ORAL_CAPSULE | Freq: Two times a day (BID) | ORAL | 0 refills | Status: DC

## 2017-12-12 NOTE — Discharge Instructions (Addendum)
Apply warm compresses to the affected area several times daily.

## 2017-12-12 NOTE — ED Provider Notes (Signed)
[facility]    CSN: [id number] Arrival date & time: 12/12/17  1546     History   Chief Complaint Chief Complaint  Patient presents with  . Cellulitis    HPI Christine Whitehead is a 22 y.o. female.   Patient developed a pruritic area on her posterior left thigh two days ago, which then became painful and increased in size.  She recalls no injury or insect bite.  No fevers, chills, and sweats.  The history is provided by the patient.  Rash  Location: left posterior thigh. Quality: dryness, itchiness, painful, redness and swelling   Quality: not blistering, not bruising, not burning, not draining, not peeling, not scaling and not weeping   Pain details:    Quality:  Aching and itching   Severity:  Mild   Onset quality:  Gradual   Duration:  2 days   Timing:  Constant   Progression:  Worsening Severity:  Mild Onset quality:  Gradual Duration:  2 days Timing:  Constant Progression:  Worsening Chronicity:  New Context: not animal contact, not chemical exposure, not exposure to similar rash, not food, not hot tub use, not insect bite/sting and not plant contact   Relieved by:  None tried Worsened by:  Contact Ineffective treatments:  None tried Associated symptoms: induration   Associated symptoms: no diarrhea, no fatigue, no fever, no joint pain, no myalgias and no nausea     Past Medical History:  Diagnosis Date  . Anxiety     Patient Active Problem List   Diagnosis Date Noted  . History of miscarriage, currently pregnant 04/22/2017  . Encounter for supervision of normal pregnancy 04/22/2017    Past Surgical History:  Procedure Laterality Date  . DILATION AND CURETTAGE OF UTERUS      OB History    Gravida  2   Para  0   Term      Preterm      AB  1   Living        SAB  1   TAB      Ectopic      Multiple      Live Births               Home Medications    Prior to Admission medications   Medication Sig Start Date End Date  Taking? Authorizing Provider  acetaminophen (TYLENOL) 325 MG tablet Take 650 mg by mouth every 6 (six) hours as needed for moderate pain.    [provider]  albuterol (PROVENTIL HFA;VENTOLIN HFA) 108 (90 BASE) MCG/ACT inhaler Inhale 2 puffs into the lungs every 6 (six) hours as needed for wheezing or shortness of breath.    [provider]  doxycycline (VIBRAMYCIN) 100 MG capsule Take 1 capsule (100 mg total) by mouth 2 (two) times daily. Take with food. 12/12/17   Lattie Haw, MD  Prenatal Vit-Fe Fumarate-FA (PRENATAL VITAMIN PO) Take 1 tablet by mouth daily.     [provider]    Family History Family History  Problem Relation Age of Onset  . Hypertension Mother   . Alcohol abuse Father   . Hypertension Sister   . Skin cancer Maternal Grandmother   . Alcoholism Paternal Uncle     Social History Social History   Tobacco Use  . Smoking status: Current Every Day Smoker    Types: Cigarettes    Last attempt to quit: 02/19/2017    Years since quitting: 0.8  . Smokeless tobacco:  Never Used  Substance Use Topics  . Alcohol use: No  . Drug use: No     Allergies   Other; Penicillins; and Blue dyes (parenteral)   Review of Systems Review of Systems  Constitutional: Negative for fatigue and fever.  Gastrointestinal: Negative for diarrhea and nausea.  Musculoskeletal: Negative for arthralgias and myalgias.  Skin: Positive for rash.  All other systems reviewed and are negative.    Physical Exam Triage Vital Signs ED Triage Vitals  Enc Vitals Group     BP 12/12/17 1627 126/84     Pulse Rate 12/12/17 1627 70     Resp --      Temp 12/12/17 1627 98.2 F (36.8 C)     Temp Source 12/12/17 1627 Oral     SpO2 12/12/17 1627 99 %     Weight 12/12/17 1628 118 lb (53.5 kg)     Height 12/12/17 1628 5' (1.524 m)     Head Circumference --      Peak Flow --      Pain Score 12/12/17 1627 6     Pain Loc --      Pain Edu? --      Excl. in GC? --     No data found.  Updated Vital Signs BP 126/84 (BP Location: Right Arm)   Pulse 70   Temp 98.2 F (36.8 C) (Oral)   Ht 5' (1.524 m)   Wt 118 lb (53.5 kg)   LMP 12/12/2017 (Exact Date)   SpO2 99%   Breastfeeding? No   BMI 23.05 kg/m   Visual Acuity Right Eye Distance:   Left Eye Distance:   Bilateral Distance:    Right Eye Near:   Left Eye Near:    Bilateral Near:     Physical Exam  Constitutional: She appears well-developed and well-nourished. No distress.  HENT:  Head: Normocephalic.  Nose: Nose normal.  Mouth/Throat: Oropharynx is clear and moist.  Eyes: Pupils are equal, round, and reactive to light.  Neck: Neck supple.  Cardiovascular: Normal rate.  Pulmonary/Chest: Effort normal.  Lymphadenopathy:    She has no cervical adenopathy.  Neurological: She is alert.  Skin: Skin is warm and dry.     Left posterior thigh has 7cm by 4cm area of erythema and tenderness, mildly indurated centrally, but not fluctuant.  Nursing note and vitals reviewed.    UC Treatments / Results  Labs (all labs ordered are listed, but only abnormal results are displayed) Labs Reviewed - No data to display  EKG None  Radiology No results found.  Procedures Procedures (including critical care time)  Medications Ordered in UC Medications - No data to display  Initial Impression / Assessment and Plan / UC Course  I have reviewed the triage vital signs and the nursing notes.  Pertinent labs & imaging results that were available during my care of the patient were reviewed by me and considered in my medical decision making (see chart for details).    Begin doxycycline  BID for staph coverage. Return if becomes worse over the next 5 to 7 days (may need I and D).   Final Clinical Impressions(s) / UC Diagnoses   Final diagnoses:  Cellulitis of leg, left     Discharge Instructions     Apply warm compresses to the affected area several times daily.    ED  Prescriptions    Medication Sig Dispense Auth. Provider   doxycycline (VIBRAMYCIN) 100 MG capsule Take 1 capsule (100 mg total)  by mouth 2 (two) times daily. Take with food. 14 capsule Lattie Haw, MD        Lattie Haw, MD 12/15/17 2027

## 2017-12-12 NOTE — ED Triage Notes (Signed)
Red painful area on back of Left upper leg x 2 days

## 2017-12-15 ENCOUNTER — Other Ambulatory Visit: Payer: Self-pay

## 2017-12-15 ENCOUNTER — Ambulatory Visit (HOSPITAL_COMMUNITY)
Admission: EM | Admit: 2017-12-15 | Discharge: 2017-12-15 | Disposition: A | Discharge status: Home / Self Care | Payer: Medicaid Other | Attending: Family Medicine | Admitting: Family Medicine

## 2017-12-15 DIAGNOSIS — L0291 Cutaneous abscess, unspecified: Secondary | ICD-10-CM | POA: Diagnosis not present

## 2017-12-15 MED ORDER — SULFAMETHOXAZOLE-TRIMETHOPRIM 800-160 MG PO TABS: 1.0000 | ORAL_TABLET | Freq: Two times a day (BID) | ORAL | 0 refills | Status: AC

## 2017-12-15 NOTE — ED Notes (Signed)
Pt discharged by provider.

## 2017-12-15 NOTE — Discharge Instructions (Signed)
Warm compresses Wash hands often Take antibiotic as directed

## 2017-12-15 NOTE — ED Triage Notes (Addendum)
Left leg abscess, per pt she did not take her antibiotics like she was told, per pt she went to another urgent care in [place] and they treat her for abscess and they said they don't know if it was MRSA or a staff infection and was they put on antibiotics, per pt it is located on the back of her left leg. Per pt she do not have anyone to talk to due to her child father just pass away.

## 2017-12-15 NOTE — ED Provider Notes (Signed)
[facility]    CSN: [id number] Arrival date & time: 12/15/17  1907     History   Chief Complaint Chief Complaint  Patient presents with  . Abscess    HPI Christine Whitehead is a 22 y.o. female.   HPI  Patient is seen 3 days ago for an abscess on her posterior thigh.  She would like to have it checked again.  She states that the redness is getting bigger.  It spontaneously drained.  The pain is still moderate.  She is taking her antibiotics as she was instructed, for the first couple of days, but stopped taking them because she did not think it was working.  No fever or chills. In addition she has bilateral conjunctivitis.  She states she got this from her infant child. We did discuss that if she has an abscess that she needs to be extremely careful with handwashing when dealing with an 31-week-old baby.  Past Medical History:  Diagnosis Date  . Anxiety     Patient Active Problem List   Diagnosis Date Noted  . History of miscarriage, currently pregnant 04/22/2017  . Encounter for supervision of normal pregnancy 04/22/2017    Past Surgical History:  Procedure Laterality Date  . DILATION AND CURETTAGE OF UTERUS      OB History    Gravida  2   Para  0   Term      Preterm      AB  1   Living        SAB  1   TAB      Ectopic      Multiple      Live Births               Home Medications    Prior to Admission medications   Medication Sig Start Date End Date Taking? Authorizing Provider  acetaminophen (TYLENOL) 325 MG tablet Take 650 mg by mouth every 6 (six) hours as needed for moderate pain.    [provider]  albuterol (PROVENTIL HFA;VENTOLIN HFA) 108 (90 BASE) MCG/ACT inhaler Inhale 2 puffs into the lungs every 6 (six) hours as needed for wheezing or shortness of breath.    [provider]  Prenatal Vit-Fe Fumarate-FA (PRENATAL VITAMIN PO) Take 1 tablet by mouth daily.     [provider]    sulfamethoxazole-trimethoprim (BACTRIM DS,SEPTRA DS) 800-160 MG tablet Take 1 tablet by mouth 2 (two) times daily for 7 days. 12/15/17 12/22/17  Eustace Moore, MD    Family History Family History  Problem Relation Age of Onset  . Hypertension Mother   . Alcohol abuse Father   . Hypertension Sister   . Skin cancer Maternal Grandmother   . Alcoholism Paternal Uncle     Social History Social History   Tobacco Use  . Smoking status: Current Every Day Smoker    Types: Cigarettes    Last attempt to quit: 02/19/2017    Years since quitting: 0.8  . Smokeless tobacco: Never Used  Substance Use Topics  . Alcohol use: No  . Drug use: No     Allergies   Other; Penicillins; and Blue dyes (parenteral)   Review of Systems Review of Systems  Constitutional: Negative for chills and fever.  HENT: Negative for ear pain and sore throat.   Eyes: Positive for photophobia and redness. Negative for pain and visual disturbance.  Respiratory: Negative for cough and shortness of breath.   Cardiovascular: Negative for chest pain  and palpitations.  Gastrointestinal: Negative for abdominal pain and vomiting.  Genitourinary: Negative for dysuria and hematuria.  Musculoskeletal: Negative for arthralgias and back pain.  Skin: Positive for wound. Negative for color change and rash.  Neurological: Negative for seizures and syncope.  All other systems reviewed and are negative.    Physical Exam Triage Vital Signs ED Triage Vitals [12/15/17 2011]  Enc Vitals Group     BP      Pulse      Resp      Temp      Temp src      SpO2      Weight      Height      Head Circumference      Peak Flow      Pain Score 4     Pain Loc      Pain Edu?      Excl. in GC?    No data found.  Updated Vital Signs LMP 12/12/2017 (Exact Date)   Visual Acuity Right Eye Distance:   Left Eye Distance:   Bilateral Distance:    Right Eye Near:   Left Eye Near:    Bilateral Near:     Physical Exam   Constitutional: She appears well-developed and well-nourished. No distress.  HENT:  Head: Normocephalic and atraumatic.  Mouth/Throat: Oropharynx is clear and moist.  Eyes: Right eye exhibits discharge.  Both eyes injected and tearing  Neck: Neck supple.  Cardiovascular: Normal rate and regular rhythm.  No murmur heard. Pulmonary/Chest: Effort normal and breath sounds normal. No respiratory distress.  Abdominal: Soft. There is no tenderness.  Musculoskeletal: She exhibits no edema.  Neurological: She is alert.  Skin: Skin is warm and dry.  Posterior right thigh has a healing abscess.  Small opening in the middle.  No fluctuance.  No drainage.  Large area of deep erythema surrounding.  Psychiatric: She has a normal mood and affect.  Nursing note and vitals reviewed.    UC Treatments / Results  Labs (all labs ordered are listed, but only abnormal results are displayed) Labs Reviewed - No data to display  EKG None  Radiology No results found.  Procedures Procedures (including critical care time)  Medications Ordered in UC Medications - No data to display  Initial Impression / Assessment and Plan / UC Course  I have reviewed the triage vital signs and the nursing notes.  Pertinent labs & imaging results that were available during my care of the patient were reviewed by me and considered in my medical decision making (see chart for details).     Question whether her antibiotic was working, or if she gave it enough time.  We will switch her to Septra DS.  Advised her to take it twice a day for the full course.  Warm compresses advised.  Handwashing emphasized. Final Clinical Impressions(s) / UC Diagnoses   Final diagnoses:  Abscess     Discharge Instructions     Warm compresses Wash hands often Take antibiotic as directed   ED Prescriptions    Medication Sig Dispense Auth. Provider   sulfamethoxazole-trimethoprim (BACTRIM DS,SEPTRA DS) 800-160 MG tablet Take 1  tablet by mouth 2 (two) times daily for 7 days. 14 tablet Eustace Moore, MD     Controlled Substance Prescriptions [place] Controlled Substance Registry consulted? Not Applicable   Eustace Moore, MD 12/15/17 [phone]

## 2017-12-16 ENCOUNTER — Ambulatory Visit: Payer: Medicaid Other | Admitting: Advanced Practice Midwife

## 2018-10-14 ENCOUNTER — Ambulatory Visit: Payer: Medicaid Other | Admitting: Certified Nurse Midwife

## 2019-06-12 IMAGING — US US MFM OB COMP +14 WKS
1 series · 14 of 28 positions shown · non-contrast
Comparison: none

[Series 1: us mfm ob comp +14 wks · 71 acquisitions, 14 frames shown]
[im 3/71]
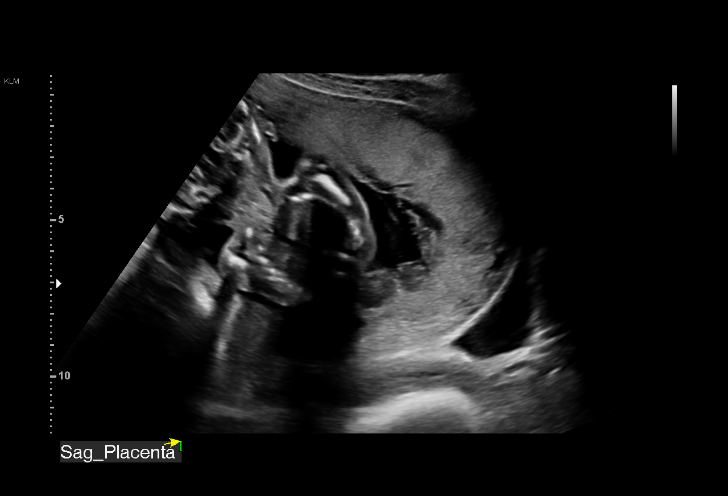
[im 8/71]
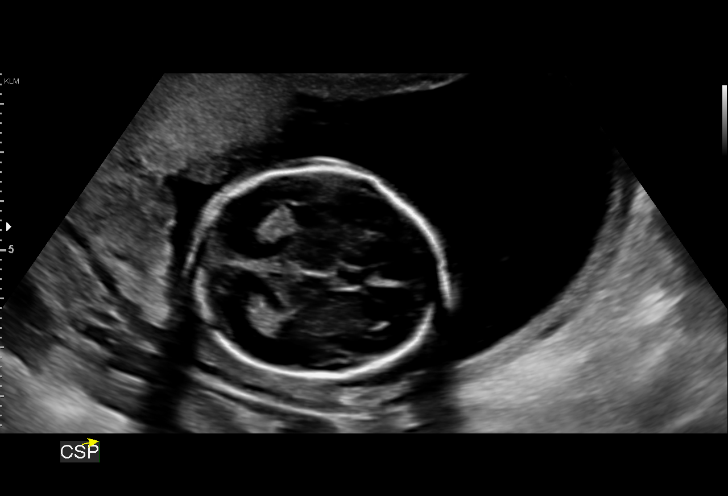
[im 13/71]
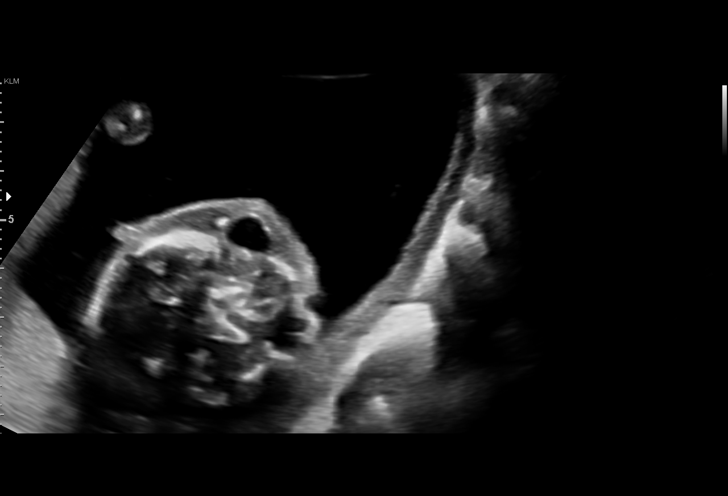
[im 19/71]
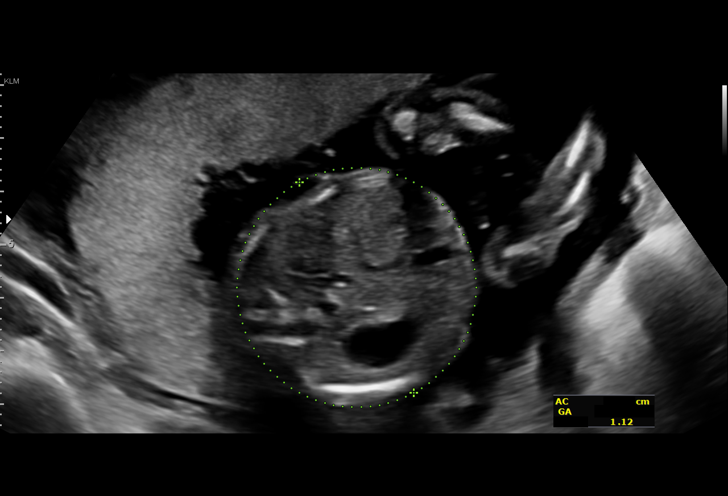
[im 24/71]
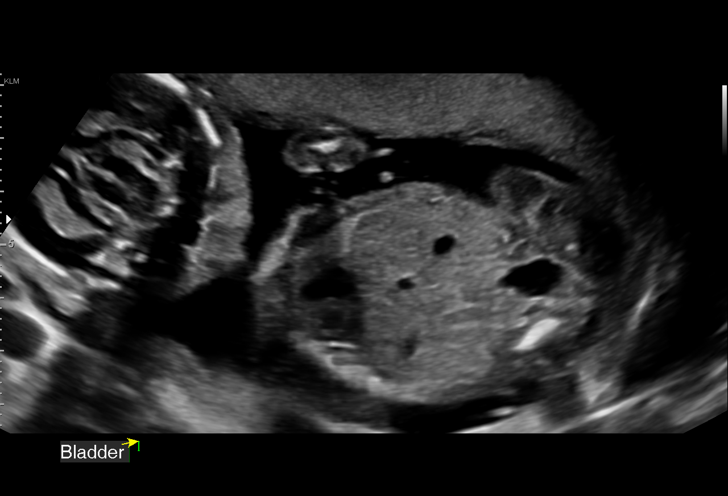
[im 29/71]
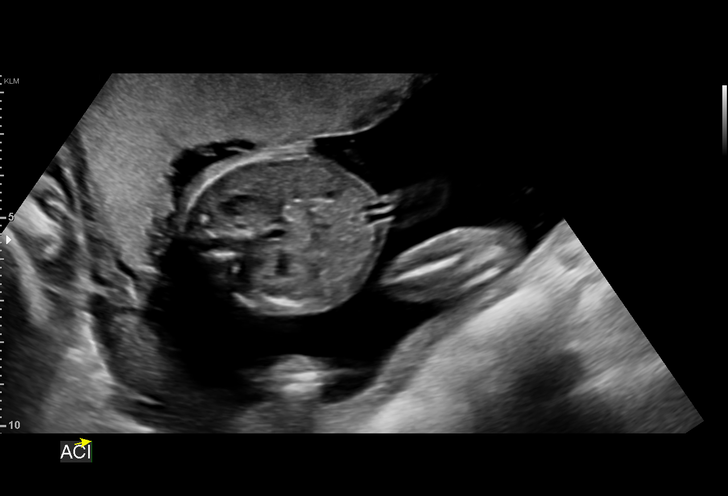
[im 34/71]
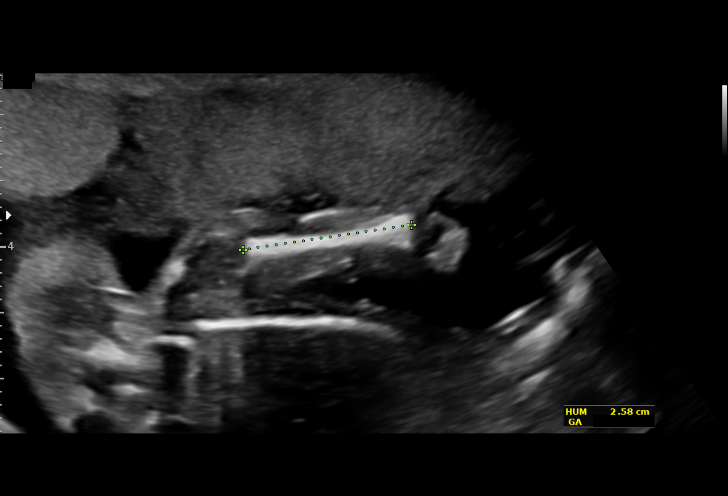
[im 39/71]
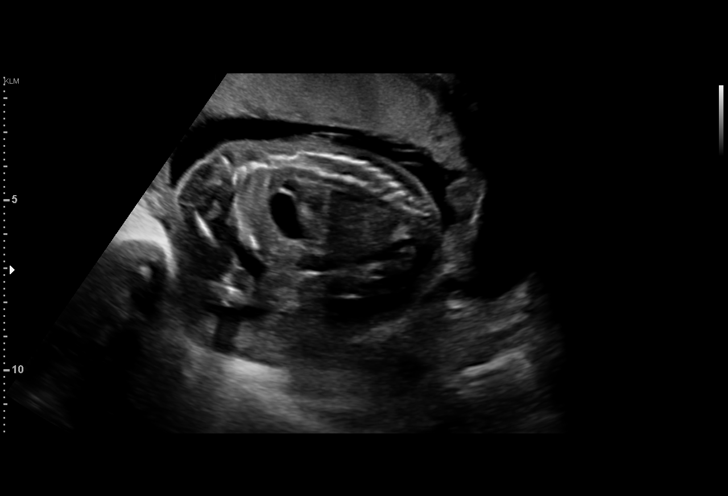
[im 45/71]
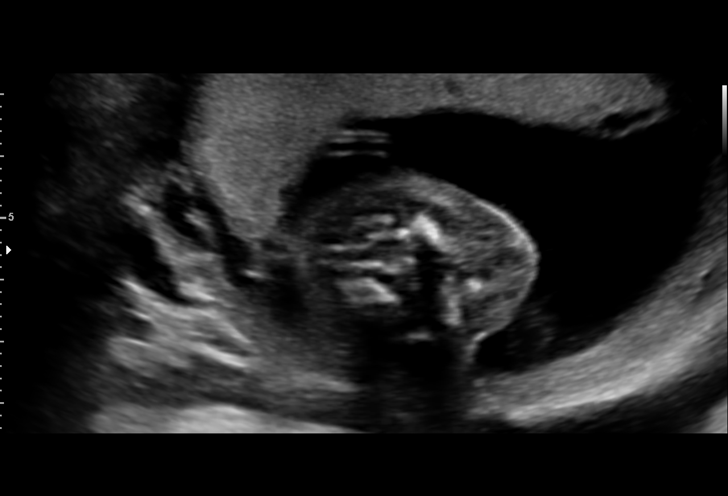
[im 50/71]
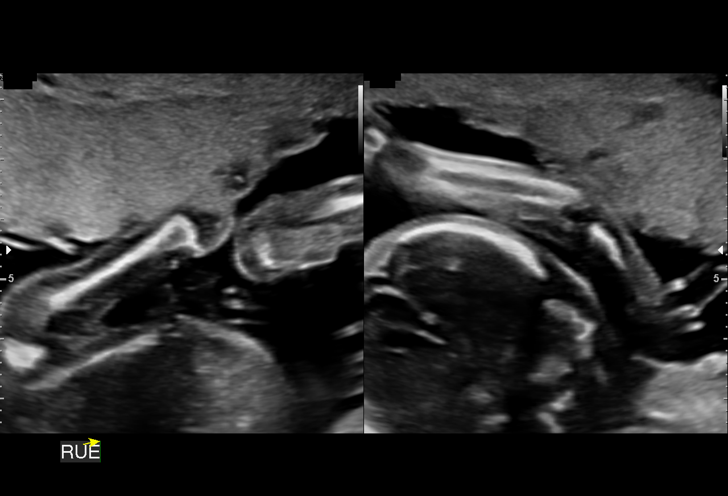
[im 55/71]
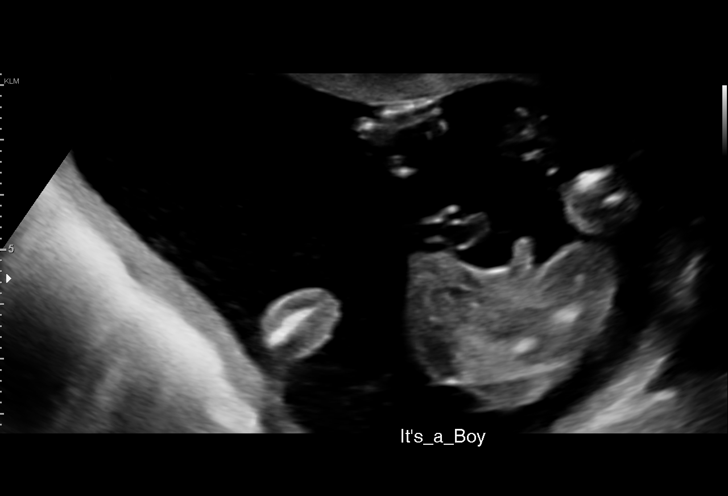
[im 60/71]
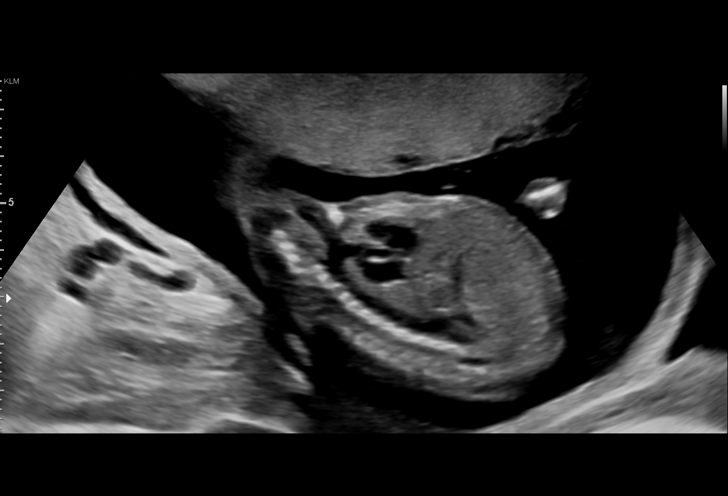
[im 65/71]
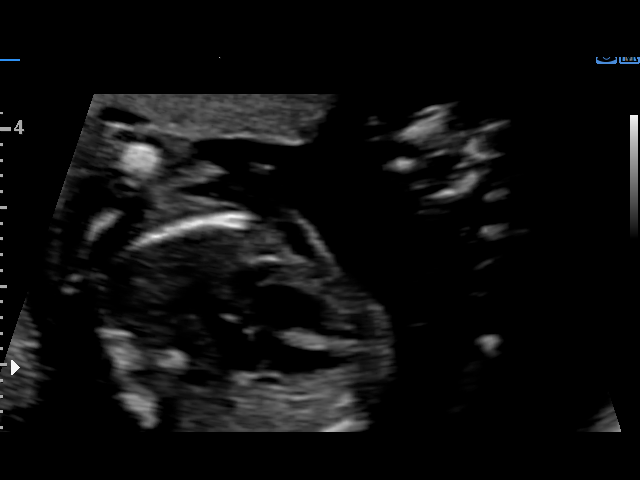
[im 71/71]
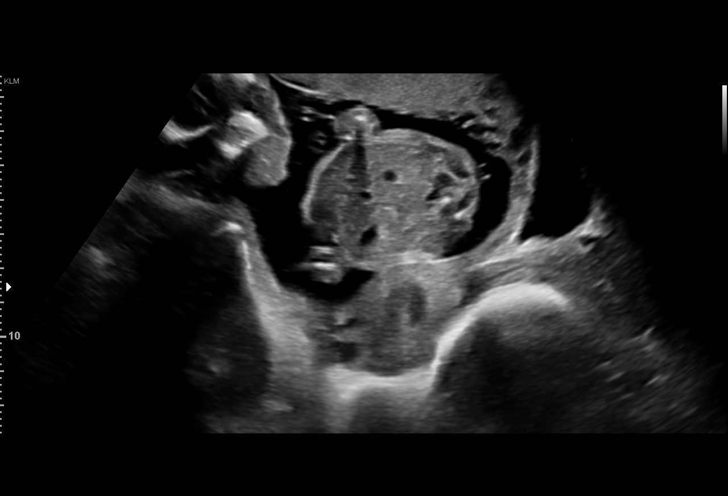

[14 of 28 positions shown; findings below may reference images not displayed]

1  MAKOBE BOY           [id number]      [id number]     [id number]
Indications

19 weeks gestation of pregnancy
Encounter for fetal anatomic survey
OB History

Gravidity:    2         Term:   0        Prem:   0        SAB:   1
TOP:          0       Ectopic:  0        Living: 0
Fetal Evaluation

Num Of Fetuses:     1
Fetal Heart         151
Rate(bpm):
Cardiac Activity:   Observed
Presentation:       Breech
Placenta:           Anterior, above cervical os
P. Cord Insertion:  Visualized

Amniotic Fluid
AFI FV:      Subjectively within normal limits
Biometry

BPD:      42.4  mm     G. Age:  18w 6d         43  %    CI:        74.68   %    70 - 86
FL/HC:      17.3   %    16.1 -
HC:      155.7  mm     G. Age:  18w 3d         21  %    HC/AC:      1.11        1.09 -
AC:      140.7  mm     G. Age:  19w 3d         61  %    FL/BPD:     63.7   %
FL:         27  mm     G. Age:  18w 1d         19  %    FL/AC:      19.2   %    20 - 24
HUM:        26  mm     G. Age:  18w 1d         28  %
CER:      19.5  mm     G. Age:  18w 5d         43  %

Est. FW:     260  gm      0 lb 9 oz     41  %
Gestational Age

LMP:           19w 0d        Date:  01/15/17                 EDD:   10/22/17
U/S Today:     18w 5d                                        EDD:   10/24/17
Best:          19w 0d     Det. By:  LMP  (01/15/17)          EDD:   10/22/17
Anatomy

Cranium:               Appears normal         Aortic Arch:            Appears normal
Cavum:                 Appears normal         Ductal Arch:            Appears normal
Ventricles:            Appears normal         Diaphragm:              Appears normal
Choroid Plexus:        Appears normal         Stomach:                Appears normal, left
sided
Cerebellum:            Appears normal         Abdomen:                Appears normal
Posterior Fossa:       Appears normal         Abdominal Wall:         Appears nml (cord
insert, abd wall)
Nuchal Fold:           Appears normal         Cord Vessels:           Appears normal (3
vessel cord)
Face:                  Appears normal         Kidneys:                Appear normal
(orbits and profile)
Lips:                  Appears normal         Bladder:                Appears normal
Thoracic:              Appears normal         Spine:                  Appears normal
Heart:                 Not well visualized    Upper Extremities:      Appears normal
RVOT:                  Appears normal         Lower Extremities:      Appears normal
LVOT:                  Appears normal

Other:  Parents do not wish to know sex of fetus. Fetus appears to be a male.
Heels and 5th digit visualized. Nasal bone visualized.
Cervix Uterus Adnexa

Cervix
Length:            3.3  cm.
Normal appearance by transabdominal scan.
Impression

SIUP at 69w2d (remote read only)
active singleton fetus
breech presentation
EFW 41st%'le
no dysmorphic features, limited views as documented above
anterior placenta
no previa
amniotic fluid is gestational age appropriate
cervix is long and closed
Recommendations

Recommend follow up attempt to complete survey in 6 weeks.

## 2022-04-23 ENCOUNTER — Emergency Department (HOSPITAL_COMMUNITY)
Admission: EM | Admit: 2022-04-23 | Discharge: 2022-04-24 | Disposition: A | Discharge status: Home / Self Care | Payer: Medicaid Other | Attending: Emergency Medicine | Admitting: Emergency Medicine

## 2022-04-23 DIAGNOSIS — R000 Tachycardia, unspecified: Secondary | ICD-10-CM | POA: Insufficient documentation

## 2022-04-23 DIAGNOSIS — F410 Panic disorder [episodic paroxysmal anxiety] without agoraphobia: Secondary | ICD-10-CM | POA: Diagnosis not present

## 2022-04-23 DIAGNOSIS — R002 Palpitations: Secondary | ICD-10-CM | POA: Insufficient documentation

## 2022-04-24 ENCOUNTER — Other Ambulatory Visit: Payer: Self-pay

## 2022-04-24 ENCOUNTER — Encounter (HOSPITAL_COMMUNITY): Payer: Self-pay

## 2022-04-24 MED ORDER — LORAZEPAM 2 MG/ML IJ SOLN
1.0000 mg | Freq: Once | INTRAMUSCULAR | Status: AC
  Administered 2022-04-24: 1 mg via INTRAVENOUS
  Filled 2022-04-24: qty 1

## 2022-04-24 MED ORDER — SODIUM CHLORIDE 0.9 % IV BOLUS: 1000.0000 mL | Freq: Once | INTRAVENOUS | Status: DC

## 2022-04-24 MED ORDER — SODIUM CHLORIDE 0.9 % IV BOLUS
1000.0000 mL | Freq: Once | INTRAVENOUS | Status: AC
  Administered 2022-04-24: 1000 mL via INTRAVENOUS

## 2022-04-24 NOTE — ED Provider Notes (Signed)
[facility]-EMERGENCY DEPT Provider Note   CSN: [id number] Arrival date & time: 04/23/22  2357     History  Chief Complaint  Patient presents with   Anxiety    Christine Whitehead is a 26 y.o. female.  Patient presents to the emergency department for evaluation of heart palpitations, anxiety.  Patient reports that she tried what she thought was a CBD gummy tonight before symptoms began.  This is the first time she has ever tried CBD.  She was taking it because she has had increased anxiety recently.       Home Medications Prior to Admission medications   Medication Sig Start Date End Date Taking? Authorizing Provider  acetaminophen (TYLENOL) 325 MG tablet Take 650 mg by mouth every 6 (six) hours as needed for moderate pain.    [provider]  albuterol (PROVENTIL HFA;VENTOLIN HFA) 108 (90 BASE) MCG/ACT inhaler Inhale 2 puffs into the lungs every 6 (six) hours as needed for wheezing or shortness of breath.    [provider]  Prenatal Vit-Fe Fumarate-FA (PRENATAL VITAMIN PO) Take 1 tablet by mouth daily.     [provider]      Allergies    Other, Penicillins, and Blue dyes (parenteral)    Review of Systems   Review of Systems  Physical Exam Updated Vital Signs BP 128/86   Pulse (!) 118   Temp 99.4 F (37.4 C) (Oral)   Resp (!) 25   Ht 5' (1.524 m)   Wt 74.4 kg   SpO2 100%   BMI 32.03 kg/m  Physical Exam Vitals and nursing note reviewed.  Constitutional:      General: She is not in acute distress.    Appearance: She is well-developed.  HENT:     Head: Normocephalic and atraumatic.     Mouth/Throat:     Mouth: Mucous membranes are moist.  Eyes:     General: Vision grossly intact. Gaze aligned appropriately.     Extraocular Movements: Extraocular movements intact.     Conjunctiva/sclera: Conjunctivae normal.  Cardiovascular:     Rate and Rhythm: Regular rhythm. Tachycardia present.     Pulses: Normal pulses.      Heart sounds: Normal heart sounds, S1 normal and S2 normal. No murmur heard.    No friction rub. No gallop.  Pulmonary:     Effort: Pulmonary effort is normal. No respiratory distress.     Breath sounds: Normal breath sounds.  Abdominal:     General: Bowel sounds are normal.     Palpations: Abdomen is soft.     Tenderness: There is no abdominal tenderness. There is no guarding or rebound.     Hernia: No hernia is present.  Musculoskeletal:        General: No swelling.     Cervical back: Full passive range of motion without pain, normal range of motion and neck supple. No spinous process tenderness or muscular tenderness. Normal range of motion.     Right lower leg: No edema.     Left lower leg: No edema.  Skin:    General: Skin is warm and dry.     Capillary Refill: Capillary refill takes less than 2 seconds.     Findings: No ecchymosis, erythema, rash or wound.  Neurological:     General: No focal deficit present.     Mental Status: She is alert and oriented to person, place, and time.     GCS: GCS eye subscore is 4. GCS  verbal subscore is 5. GCS motor subscore is 6.     Cranial Nerves: Cranial nerves 2-12 are intact.     Sensory: Sensation is intact.     Motor: Motor function is intact.     Coordination: Coordination is intact.  Psychiatric:        Attention and Perception: Attention normal.        Mood and Affect: Mood is anxious.        Speech: Speech normal.        Behavior: Behavior normal.     ED Results / Procedures / Treatments   Labs (all labs ordered are listed, but only abnormal results are displayed) Labs Reviewed - No data to display  EKG EKG Interpretation  Date/Time:  Tuesday April 24 2022 00:34:22 EDT Ventricular Rate:  160 PR Interval:  96 QRS Duration: 68 QT Interval:  314 QTC Calculation: 512 R Axis:   62 Text Interpretation: Sinus tachycardia with short PR Otherwise normal ECG No previous ECGs available Confirmed by Orpah Greek  [id number]) on 04/24/2022 12:48:26 AM  Radiology No results found.  Procedures Procedures    Medications Ordered in ED Medications  sodium chloride 0.9 % bolus 1,000 mL (0 mLs Intravenous Stopped 04/24/22 0225)  LORazepam (ATIVAN) injection 1 mg (1 mg Intravenous Given 04/24/22 0059)    ED Course/ Medical Decision Making/ A&P                           Medical Decision Making Risk Prescription drug management.   Patient presents to the emergency department for evaluation of severe anxiety and panic attack after trying a CBD gummy that she got at a local vape shop.  It was 100 mg, but she only took a portion of it.  Patient appears very anxious at arrival, sinus tachycardia on the monitor and EKG.  Patient administered IV fluids, Ativan and monitored.        Final Clinical Impression(s) / ED Diagnoses Final diagnoses:  Panic attack    Rx / DC Orders ED Discharge Orders     None         Juleon Narang, Gwenyth Allegra, MD 04/24/22 [phone]

## 2022-04-24 NOTE — ED Triage Notes (Addendum)
Pt reports with anxiety since tonight. Pt states that she tried a cbd gummy (around 10 pm) delta 100 mg.

## 2023-04-27 ENCOUNTER — Ambulatory Visit
Admission: EM | Admit: 2023-04-27 | Discharge: 2023-04-27 | Disposition: A | Discharge status: Home / Self Care | Payer: MEDICAID | Attending: Internal Medicine | Admitting: Internal Medicine

## 2023-04-27 DIAGNOSIS — F419 Anxiety disorder, unspecified: Secondary | ICD-10-CM

## 2023-04-27 DIAGNOSIS — J069 Acute upper respiratory infection, unspecified: Secondary | ICD-10-CM | POA: Diagnosis not present

## 2023-04-27 DIAGNOSIS — B349 Viral infection, unspecified: Secondary | ICD-10-CM

## 2023-04-27 DIAGNOSIS — Z1152 Encounter for screening for COVID-19: Secondary | ICD-10-CM | POA: Insufficient documentation

## 2023-04-27 DIAGNOSIS — R070 Pain in throat: Secondary | ICD-10-CM

## 2023-04-27 DIAGNOSIS — J4530 Mild persistent asthma, uncomplicated: Secondary | ICD-10-CM

## 2023-04-27 LAB — POCT RAPID STREP A (OFFICE): Rapid Strep A Screen: NEGATIVE

## 2023-04-27 MED ORDER — PREDNISONE 10 MG PO TABS
30.0000 mg | ORAL_TABLET | Freq: Every day | ORAL | 0 refills | Status: AC
Start: 2023-04-27 — End: ?

## 2023-04-27 MED ORDER — VENTOLIN HFA 108 (90 BASE) MCG/ACT IN AERS: 1.0000 | INHALATION_SPRAY | Freq: Four times a day (QID) | RESPIRATORY_TRACT | 0 refills | Status: AC | PRN

## 2023-04-27 MED ORDER — PROMETHAZINE-DM 6.25-15 MG/5ML PO SYRP
5.0000 mL | ORAL_SOLUTION | Freq: Three times a day (TID) | ORAL | 0 refills | Status: AC | PRN
Start: 2023-04-27 — End: ?

## 2023-04-27 MED ORDER — CETIRIZINE HCL 10 MG PO TABS: 10.0000 mg | ORAL_TABLET | Freq: Every day | ORAL | 0 refills | Status: AC

## 2023-04-27 MED ORDER — HYDROXYZINE HCL 25 MG PO TABS: 12.5000 mg | ORAL_TABLET | Freq: Three times a day (TID) | ORAL | 0 refills | Status: AC | PRN

## 2023-04-27 NOTE — ED Provider Notes (Signed)
[facility]  Note:  This document was prepared using Conservation officer, historic buildings and may include unintentional dictation errors.  MRN: [id number] DOB: Nov 30, 1995  Subjective:   Christine Whitehead is a 27 y.o. female presenting for 1 day history of throat pain, chest congestion, coughing, malaise and fatigue, painful swallowing.  Patient has used multiple over-the-counter medications.  She has a history of asthma, needs a refill of her albuterol.  She is a smoker.  No current facility-administered medications for this encounter.  Current Outpatient Medications:    Nutritional Supplements (COLD AND FLU PO), Take by mouth., Disp: , Rfl:    acetaminophen (TYLENOL) 325 MG tablet, Take 650 mg by mouth every 6 (six) hours as needed for moderate pain., Disp: , Rfl:    albuterol (PROVENTIL HFA;VENTOLIN HFA) 108 (90 BASE) MCG/ACT inhaler, Inhale 2 puffs into the lungs every 6 (six) hours as needed for wheezing or shortness of breath., Disp: , Rfl:    hydrOXYzine (ATARAX) 10 MG tablet, Take by mouth., Disp: , Rfl:    Prenatal Vit-Fe Fumarate-FA (PRENATAL VITAMIN PO), Take 1 tablet by mouth daily. , Disp: , Rfl:    Allergies  Allergen Reactions   Diphenhydramine Hcl Other (See Comments)    insomnia   Other Hives    Diacell cough medicine   Penicillins Nausea And Vomiting    Has patient had a PCN reaction causing immediate rash, facial/tongue/throat swelling, SOB or lightheadedness with hypotension: No Has patient had a PCN reaction causing severe rash involving mucus membranes or skin necrosis: No Has patient had a PCN reaction that required hospitalization: No Has patient had a PCN reaction occurring within the last 10 years: No If all of the above answers are "NO", then may proceed with Cephalosporin use.    Blue Dyes (Parenteral) Itching and Rash    Past Medical History:  Diagnosis Date   Anxiety      Past Surgical History:  Procedure Laterality Date    DILATION AND CURETTAGE OF UTERUS      Family History  Problem Relation Age of Onset   Hypertension Mother    Alcohol abuse Father    Hypertension Sister    Skin cancer Maternal Grandmother    Alcoholism Paternal Uncle     Social History   Tobacco Use   Smoking status: Every Day    Current packs/day: 0.00    Types: Cigarettes    Last attempt to quit: 02/19/2017    Years since quitting: 6.1   Smokeless tobacco: Never  Vaping Use   Vaping status: Never Used  Substance Use Topics   Alcohol use: Yes    Comment: Occa   Drug use: No    ROS   Objective:   Vitals: BP 128/85 (BP Location: Right Arm)   Pulse 99   Temp 99.2 F (37.3 C) (Oral)   Resp 18   LMP  (Within Weeks) Comment: 1 week  SpO2 98%   Physical Exam Constitutional:      General: She is not in acute distress.    Appearance: Normal appearance. She is well-developed and normal weight. She is not ill-appearing, toxic-appearing or diaphoretic.  HENT:     Head: Normocephalic and atraumatic.     Right Ear: Tympanic membrane, ear canal and external ear normal. No drainage or tenderness. No middle ear effusion. There is no impacted cerumen. Tympanic membrane is not erythematous or bulging.     Left Ear: Tympanic membrane, ear canal and external ear  normal. No drainage or tenderness.  No middle ear effusion. There is no impacted cerumen. Tympanic membrane is not erythematous or bulging.     Nose: Congestion present. No rhinorrhea.     Mouth/Throat:     Mouth: Mucous membranes are moist. No oral lesions.     Pharynx: No pharyngeal swelling, oropharyngeal exudate, posterior oropharyngeal erythema or uvula swelling.     Tonsils: No tonsillar exudate or tonsillar abscesses.  Eyes:     General: No scleral icterus.       Right eye: No discharge.        Left eye: No discharge.     Extraocular Movements: Extraocular movements intact.     Right eye: Normal extraocular motion.     Left eye: Normal extraocular motion.      Conjunctiva/sclera: Conjunctivae normal.  Cardiovascular:     Rate and Rhythm: Normal rate and regular rhythm.     Heart sounds: Normal heart sounds. No murmur heard.    No friction rub. No gallop.  Pulmonary:     Effort: Pulmonary effort is normal. No respiratory distress.     Breath sounds: No stridor. No wheezing, rhonchi or rales.  Chest:     Chest wall: No tenderness.  Musculoskeletal:     Cervical back: Normal range of motion and neck supple.  Lymphadenopathy:     Cervical: No cervical adenopathy.  Skin:    General: Skin is warm and dry.  Neurological:     General: No focal deficit present.     Mental Status: She is alert and oriented to person, place, and time.  Psychiatric:        Mood and Affect: Mood normal.        Behavior: Behavior normal.     Results for orders placed or performed during the hospital encounter of 04/27/23 (from the past 24 hour(s))  POCT rapid strep A     Status: None   Collection Time: 04/27/23  3:49 PM  Result Value Ref Range   Rapid Strep A Screen Negative Negative     Assessment and Plan :   PDMP not reviewed this encounter.  1. Acute viral syndrome   2. Throat pain   3. Anxiety   4. Mild persistent asthma without complication    Deferred imaging given clear cardiopulmonary exam, hemodynamically stable vital signs.  Given the respiratory symptoms, asthma and smoking recommended an oral prednisone course.  Refilled her albuterol inhaler.  Will manage for viral illness such as viral URI, viral syndrome, viral rhinitis, COVID-19. Recommended supportive care. Offered scripts for symptomatic relief. Testing is pending. Counseled patient on potential for adverse effects with medications prescribed/recommended today, ER and return-to-clinic precautions discussed, patient verbalized understanding.     Wallis Bamberg, [place] 04/28/23 [id number]

## 2023-04-27 NOTE — ED Triage Notes (Signed)
Pt reports sore throat, swelling glands in neck, chest congestion x 1 day. OTC cold and flu gives some relief.

## 2023-04-27 NOTE — Discharge Instructions (Signed)
We will notify you of your test results as they arrive and may take between about 24 hours.  I encourage you to sign up for MyChart if you have not already done so as this can be the easiest way for [place] to communicate results to you online or through a phone app.  Generally, we only contact you if it is a positive test result.  In the meantime, if you develop worsening symptoms including fever, chest pain, shortness of breath despite our current treatment plan then please report to the emergency room as this may be a sign of worsening status from possible viral infection.  Otherwise, we will manage this as a viral syndrome. For sore throat or cough try using a honey-based tea. Use 3 teaspoons of honey with juice squeezed from half lemon. Place shaved pieces of ginger into 1/2-1 cup of water and warm over stove top. Then mix the ingredients and repeat every 4 hours as needed. Please take Tylenol 650mg  every 6 hours for aches and pains, fevers. Hydrate very well with at least 2 liters of water. Eat light meals such as soups to replenish electrolytes and soft fruits, veggies. Start an antihistamine like Zyrtec (10mg  daily) for postnasal drainage, sinus congestion.  You can take this together with prednisone and albuterol inhaler.  Use the cough medications as needed.

## 2023-04-28 LAB — SARS CORONAVIRUS 2 (TAT 6-24 HRS): SARS Coronavirus 2: NEGATIVE

## 2023-04-29 LAB — CULTURE, GROUP A STREP (THRC)
